# Patient Record
Sex: Female | Born: 1990 | Race: Black or African American | Hispanic: No | Marital: Married | Smoking: Current every day smoker
Health system: Southern US, Community
[De-identification: ages and names within clinical notes are randomized; demographics above are authoritative.]

## PROBLEM LIST (undated history)

## (undated) DIAGNOSIS — B1920 Unspecified viral hepatitis C without hepatic coma: Secondary | ICD-10-CM

## (undated) DIAGNOSIS — I10 Essential (primary) hypertension: Secondary | ICD-10-CM

## (undated) DIAGNOSIS — D6800 Von Willebrand disease, unspecified: Secondary | ICD-10-CM

## (undated) HISTORY — DX: Von Willebrand disease, unspecified: D68.00

## (undated) HISTORY — DX: Unspecified viral hepatitis C without hepatic coma: B19.20

## (undated) HISTORY — DX: Von Willebrand's disease: D68.0

---

## 2017-08-02 HISTORY — PX: MOUTH SURGERY: SHX715

## 2019-05-22 LAB — OB RESULTS CONSOLE RUBELLA ANTIBODY, IGM: Rubella: IMMUNE

## 2019-05-22 LAB — HEPATITIS C ANTIBODY: HCV Ab: POSITIVE

## 2019-05-22 LAB — OB RESULTS CONSOLE HEPATITIS B SURFACE ANTIGEN: Hepatitis B Surface Ag: NEGATIVE

## 2019-08-03 NOTE — L&D Delivery Note (Addendum)
OB/GYN Faculty Practice Delivery Note  Brittany Barker is a 29 y.o. G2P1011 s/p VD at [redacted]w[redacted]d. She was admitted for gHTN.   ROM: 18h 26m with clear fluid GBS Status:  Negative/-- (04/14 0329) Maximum Maternal Temperature: 99.63F   Labor Progress: . Patient presented to L&D for gHTN. Initial SVE: 1/30/-3. She received Cytotec x3, foley bulb, had AROM and pitocin. She then progressed to complete.   Delivery Date/Time: 11/28/19 1624 Delivery: Called to room and patient was complete and pushing. Head delivered LOA. Short placental cord present. Shoulder and body delivered in usual fashion. Infant with spontaneous cry, placed on mother's abdomen, dried and stimulated. Cord clamped x 2 after 1-minute delay, and cut by FOB. Cord blood drawn. Placenta delivered spontaneously with gentle cord traction. Fundus firm with massage and Pitocin. Labia, perineum, vagina, and cervix inspected inspected with first degree perineal and right periurethral lacerations.  Right periurethral was hemostatic.  First degree perineal was repaired with 3-0 Vicryl Rapide that was repaired in the standard fashion. Mom with von Willebrand disease. TXA given at delivery.   Baby Weight: 3065 grams  Placenta: Sent to L&D Complications: None Lacerations: right periurethral and first degree perineal, perineal laceration repaired with 3-0 Vicryl rapide EBL: 450 mL Analgesia: Epidural   Infant: APGAR (1 MIN): 8   APGAR (5 MINS): 9    Brittany Cabal, DO PGY-1, [facility] 11/28/2019 5:24 PM      OB FELLOW ATTESTATION  I was present, gloved, and supervising throughout delivery and have edited the above note to reflect any changes or updates.  Brittany Seal, MD/[facility] OB Fellow  11/28/2019, 5:43 PM

## 2019-08-19 ENCOUNTER — Emergency Department (HOSPITAL_COMMUNITY)
Admission: EM | Admit: 2019-08-19 | Discharge: 2019-08-19 | Disposition: A | Discharge status: Home / Self Care | Payer: Medicaid Other | Attending: Emergency Medicine | Admitting: Emergency Medicine

## 2019-08-19 ENCOUNTER — Encounter (HOSPITAL_COMMUNITY): Payer: Self-pay

## 2019-08-19 ENCOUNTER — Emergency Department (HOSPITAL_COMMUNITY): Payer: Medicaid Other

## 2019-08-19 DIAGNOSIS — R102 Pelvic and perineal pain: Secondary | ICD-10-CM

## 2019-08-19 DIAGNOSIS — Z20822 Contact with and (suspected) exposure to covid-19: Secondary | ICD-10-CM | POA: Insufficient documentation

## 2019-08-19 DIAGNOSIS — O99891 Other specified diseases and conditions complicating pregnancy: Secondary | ICD-10-CM | POA: Diagnosis not present

## 2019-08-19 DIAGNOSIS — R109 Unspecified abdominal pain: Secondary | ICD-10-CM | POA: Insufficient documentation

## 2019-08-19 DIAGNOSIS — Z3A24 24 weeks gestation of pregnancy: Secondary | ICD-10-CM | POA: Insufficient documentation

## 2019-08-19 DIAGNOSIS — R05 Cough: Secondary | ICD-10-CM | POA: Insufficient documentation

## 2019-08-19 DIAGNOSIS — Z79899 Other long term (current) drug therapy: Secondary | ICD-10-CM | POA: Insufficient documentation

## 2019-08-19 DIAGNOSIS — Z7189 Other specified counseling: Secondary | ICD-10-CM

## 2019-08-19 DIAGNOSIS — O26899 Other specified pregnancy related conditions, unspecified trimester: Secondary | ICD-10-CM

## 2019-08-19 LAB — URINALYSIS, ROUTINE W REFLEX MICROSCOPIC
Bilirubin Urine: NEGATIVE
Glucose, UA: NEGATIVE mg/dL
Hgb urine dipstick: NEGATIVE
Ketones, ur: NEGATIVE mg/dL
Leukocytes,Ua: NEGATIVE
Nitrite: NEGATIVE
Protein, ur: NEGATIVE mg/dL
Specific Gravity, Urine: 1.01 (ref 1.005–1.030)
pH: 7 (ref 5.0–8.0)

## 2019-08-19 LAB — SARS CORONAVIRUS 2 (TAT 6-24 HRS): SARS Coronavirus 2: NEGATIVE

## 2019-08-19 MED ORDER — ACETAMINOPHEN 325 MG PO TABS
650.0000 mg | ORAL_TABLET | Freq: Once | ORAL | Status: AC
  Administered 2019-08-19: 650 mg via ORAL
  Filled 2019-08-19: qty 2

## 2019-08-19 NOTE — ED Provider Notes (Addendum)
[facility]-EMERGENCY DEPT Provider Note   CSN: [id number] Arrival date & time: 08/19/19  4696     History Chief Complaint  Patient presents with  . Cough    [redacted] weeks pregnant  . Flank Pain    Brittany Barker is a 29 y.o. female who is currently 23 weeks, presents to ED with a chief complaint of left-sided flank pain and cough.  States that the pain has been going on for the past 2 to 3 days and has been intermittent.  She denies any dysuria or hematuria.  States that her vaginal discharge has not changed since from when she found out she was pregnant.  She recently area 3 weeks ago and has not yet established care with an OB/GYN.  She has also been complaining of a cough for the past week.  Sick contacts at home with similar symptoms but believes that it is due to environmental factors rather than infectious.  She denies any injuries or falls, abnormal vaginal bleeding, productive cough, fever, chest pain or shortness of breath.  HPI     History reviewed. No pertinent past medical history.  There are no problems to display for this patient.    OB History    Gravida  1   Para      Term      Preterm      AB      Living        SAB      TAB      Ectopic      Multiple      Live Births              History reviewed. No pertinent family history.  Social History   Tobacco Use  . Smoking status: Not on file  Substance Use Topics  . Alcohol use: Not on file  . Drug use: Not on file    Home Medications Prior to Admission medications   Medication Sig Start Date End Date Taking? Authorizing Provider  Ascorbic Acid (VITAMIN C) 1000 MG tablet Take 1,000 mg by mouth daily.   Yes [provider]  ibuprofen (ADVIL) 200 MG tablet Take 600 mg by mouth every 6 (six) hours as needed for moderate pain.    Yes [provider]  OVER THE COUNTER MEDICATION Take 1 Package by mouth daily as needed (sick).   Yes [provider]    Prenatal Vit-Fe Fumarate-FA (MULTIVITAMIN-PRENATAL) 27-0.8 MG TABS tablet Take 1 tablet by mouth daily at 12 noon.   Yes [provider]    Allergies    Patient has no known allergies.  Review of Systems   Review of Systems  Constitutional: Negative for appetite change, chills and fever.  HENT: Negative for ear pain, rhinorrhea, sneezing and sore throat.   Eyes: Negative for photophobia and visual disturbance.  Respiratory: Positive for cough. Negative for chest tightness, shortness of breath and wheezing.   Cardiovascular: Negative for chest pain and palpitations.  Gastrointestinal: Negative for abdominal pain, blood in stool, constipation, diarrhea, nausea and vomiting.  Genitourinary: Positive for flank pain. Negative for dysuria, hematuria and urgency.  Musculoskeletal: Negative for myalgias.  Skin: Negative for rash.  Neurological: Negative for dizziness, weakness and light-headedness.    Physical Exam Updated Vital Signs BP 130/79   Pulse 71   Temp 98.3 F (36.8 C) (Oral)   Resp 13   Ht 5\' 7"  (1.702 m)   SpO2 100%   Physical Exam Vitals  and nursing note reviewed.  Constitutional:      General: She is not in acute distress.    Appearance: She is well-developed.     Comments: Nontoxic appearing and in no acute distress.  HENT:     Head: Normocephalic and atraumatic.     Nose: Nose normal.  Eyes:     General: No scleral icterus.       Right eye: No discharge.        Left eye: No discharge.     Conjunctiva/sclera: Conjunctivae normal.  Cardiovascular:     Rate and Rhythm: Normal rate and regular rhythm.     Heart sounds: Normal heart sounds. No murmur. No friction rub. No gallop.   Pulmonary:     Effort: Pulmonary effort is normal. No respiratory distress.     Breath sounds: Normal breath sounds.  Abdominal:     General: Bowel sounds are normal. There is no distension.     Palpations: Abdomen is soft.     Tenderness: There is abdominal tenderness (L  flank). There is no guarding.     Comments: Gravid uterus.  Musculoskeletal:        General: Normal range of motion.     Cervical back: Normal range of motion and neck supple.  Skin:    General: Skin is warm and dry.     Findings: No rash.  Neurological:     Mental Status: She is alert.     Motor: No abnormal muscle tone.     Coordination: Coordination normal.     ED Results / Procedures / Treatments   Labs (all labs ordered are listed, but only abnormal results are displayed) Labs Reviewed  SARS CORONAVIRUS 2 (TAT 6-24 HRS)  URINE CULTURE  URINALYSIS, ROUTINE W REFLEX MICROSCOPIC    EKG None  Radiology US OB Limited  Result Date: 08/19/2019 CLINICAL DATA:  Right lower quadrant pain for 1 week EXAM: LIMITED OBSTETRIC ULTRASOUND FINDINGS: Number of Fetuses: 1 Heart Rate:  149 bpm Movement: Yes Presentation: Cephalic Placental Location: Fundal Previa: No Amniotic Fluid (Subjective):  Within normal limits. BPD: 6.1 cm 24 w  5 d MATERNAL FINDINGS: Cervix:  Appears closed. Uterus/Adnexae: Right ovary appears unremarkable. Left ovary was not visualized. IMPRESSION: 1. Single live intrauterine gestation measuring 24 weeks 5 days by BPD. 2. Active fetal heart tones at 149 beats per minute. This exam is performed on an emergent basis and does not comprehensively evaluate fetal size, dating, or anatomy; follow-up complete OB US should be considered if further fetal assessment is warranted. Electronically Signed   By: Duanne Guess D.O.   On: 08/19/2019 11:57    Procedures Procedures (including critical care time)  Medications Ordered in ED Medications  acetaminophen (TYLENOL) tablet 650 mg (650 mg Oral Given 08/19/19 1153)    ED Course  I have reviewed the triage vital signs and the nursing notes.  Pertinent labs & imaging results that were available during my care of the patient were reviewed by me and considered in my medical decision making (see chart for details).    MDM  Rules/Calculators/A&P                      Brittany Barker was evaluated in Emergency Department on 08/19/2019 for the symptoms described in the history of present illness. She was evaluated in the context of the global COVID-19 pandemic, which necessitated consideration that the patient might be at risk for infection with the SARS-CoV-2 virus that  causes COVID-19. Institutional protocols and algorithms that pertain to the evaluation of patients at risk for COVID-19 are in a state of rapid change based on information released by regulatory bodies including the CDC and federal and state organizations. These policies and algorithms were followed during the patient's care in the ED.  29yo F at [redacted] weeks gestation, who presents to the ED for cough and left-sided flank pain. Denies SOB, chest pain, abdominal pain, abnormal vaginal bleeding. She is afebrile and no signs of respiratory distress.  No rebound guarding.  She is requesting COVID testing. Denies urinary symptoms. Declines pelvic exam or blood work, denies vaginal discharge or concern for STDs. UA negative here. U/S without abnormal findings. No signs of pyelonephritis on exam, no CVA tenderness. Pain controlled here with Tylenol. Able to tolerate PO intake without difficulty. Will have her f/u with OB and PCP and return for worsening symptoms.  Patient is hemodynamically stable, in NAD, and able to ambulate in the ED. Evaluation does not show pathology that would require ongoing emergent intervention or inpatient treatment. I explained the diagnosis to the patient. Pain has been managed and has no complaints prior to discharge. Patient is comfortable with above plan and is stable for discharge at this time. All questions were answered prior to disposition. Strict return precautions for returning to the ED were discussed. Encouraged follow up with PCP.   An After Visit Summary was printed and given to the patient.   Portions of this note were generated with  [employer]. Dictation errors may occur despite best attempts at proofreading.  Final Clinical Impression(s) / ED Diagnoses Final diagnoses:  [redacted] weeks gestation of pregnancy  Educated about 2019 novel coronavirus infection    Rx / DC Orders ED Discharge Orders    None       Delia Heady, PA-C 08/19/19 1245    Veryl Speak, MD 08/19/19 1444

## 2019-08-19 NOTE — Discharge Instructions (Signed)
We will contact you with the results of your Covid test when available. Continue your home medications as previously prescribed. Follow up with the women's clinic and the PCP listed below. Return to the ED if you start to have worsening abdominal pain, shortness of breath, chest pain, abnormal bleeding.

## 2019-08-19 NOTE — ED Triage Notes (Signed)
Pt reports that she is [redacted] weeks pregnant. States that she last saw a doctor 10 weeks ago because she recently moved. She is complaining of a nonproductive cough, a headache, and L flank pain. She also states that she feels a lump where the pain is on her L side. A&Ox4. Ambulatory. Wants a referral for a PCP.

## 2019-08-20 LAB — URINE CULTURE: Culture: <10000 — AB

## 2019-09-14 ENCOUNTER — Encounter: Payer: Self-pay | Admitting: Certified Nurse Midwife

## 2019-09-14 ENCOUNTER — Other Ambulatory Visit: Payer: Self-pay

## 2019-09-14 ENCOUNTER — Ambulatory Visit (INDEPENDENT_AMBULATORY_CARE_PROVIDER_SITE_OTHER): Payer: Medicaid Other | Admitting: Certified Nurse Midwife

## 2019-09-14 ENCOUNTER — Other Ambulatory Visit: Payer: Medicaid Other

## 2019-09-14 VITALS — BP 120/80 | HR 84 | Wt 232.0 lb

## 2019-09-14 DIAGNOSIS — Z3481 Encounter for supervision of other normal pregnancy, first trimester: Secondary | ICD-10-CM

## 2019-09-14 DIAGNOSIS — D6800 Von Willebrand disease, unspecified: Secondary | ICD-10-CM

## 2019-09-14 DIAGNOSIS — Z3480 Encounter for supervision of other normal pregnancy, unspecified trimester: Secondary | ICD-10-CM

## 2019-09-14 DIAGNOSIS — O98413 Viral hepatitis complicating pregnancy, third trimester: Secondary | ICD-10-CM | POA: Insufficient documentation

## 2019-09-14 DIAGNOSIS — O98411 Viral hepatitis complicating pregnancy, first trimester: Secondary | ICD-10-CM

## 2019-09-14 DIAGNOSIS — Z8659 Personal history of other mental and behavioral disorders: Secondary | ICD-10-CM

## 2019-09-14 DIAGNOSIS — O99330 Smoking (tobacco) complicating pregnancy, unspecified trimester: Secondary | ICD-10-CM

## 2019-09-14 DIAGNOSIS — F1911 Other psychoactive substance abuse, in remission: Secondary | ICD-10-CM

## 2019-09-14 DIAGNOSIS — O99331 Smoking (tobacco) complicating pregnancy, first trimester: Secondary | ICD-10-CM

## 2019-09-14 DIAGNOSIS — Z3A11 11 weeks gestation of pregnancy: Secondary | ICD-10-CM

## 2019-09-14 HISTORY — DX: Encounter for supervision of other normal pregnancy, unspecified trimester: Z34.80

## 2019-09-14 MED ORDER — SERTRALINE HCL 50 MG PO TABS: 50.0000 mg | ORAL_TABLET | Freq: Every day | ORAL | 2 refills | Status: DC

## 2019-09-14 MED ORDER — BLOOD PRESSURE KIT: 1.0000 | PACK | Freq: Every day | 0 refills | Status: AC

## 2019-09-14 NOTE — Patient Instructions (Addendum)
Third Trimester of Pregnancy  The third trimester is from week 28 through week 40 (months 7 through 9). This trimester is when your unborn baby (fetus) is growing very fast. At the end of the ninth month, the unborn baby is about 20 inches in length. It weighs about 6-10 pounds. Follow these instructions at home: Medicines  Take over-the-counter and prescription medicines only as told by your doctor. Some medicines are safe and some medicines are not safe during pregnancy.  Take a prenatal vitamin that contains at least 600 micrograms (mcg) of folic acid.  If you have trouble pooping (constipation), take medicine that will make your stool soft (stool softener) if your doctor approves. Eating and drinking   Eat regular, healthy meals.  Avoid raw meat and uncooked cheese.  If you get low calcium from the food you eat, talk to your doctor about taking a daily calcium supplement.  Eat four or five small meals rather than three large meals a day.  Avoid foods that are high in fat and sugars, such as fried and sweet foods.  To prevent constipation: ? Eat foods that are high in fiber, like fresh fruits and vegetables, whole grains, and beans. ? Drink enough fluids to keep your pee (urine) clear or pale yellow. Activity  Exercise only as told by your doctor. Stop exercising if you start to have cramps.  Avoid heavy lifting, wear low heels, and sit up straight.  Do not exercise if it is too hot, too humid, or if you are in a place of great height (high altitude).  You may continue to have sex unless your doctor tells you not to. Relieving pain and discomfort  Wear a good support bra if your breasts are tender.  Take frequent breaks and rest with your legs raised if you have leg cramps or low back pain.  Take warm water baths (sitz baths) to soothe pain or discomfort caused by hemorrhoids. Use hemorrhoid cream if your doctor approves.  If you develop puffy, bulging veins (varicose  veins) in your legs: ? Wear support hose or compression stockings as told by your doctor. ? Raise (elevate) your feet for 15 minutes, 3-4 times a day. ? Limit salt in your food. Safety  Wear your seat belt when driving.  Make a list of emergency phone numbers, including numbers for family, friends, the hospital, and police and fire departments. Preparing for your baby's arrival To prepare for the arrival of your baby:  Take prenatal classes.  Practice driving to the hospital.  Visit the hospital and tour the maternity area.  Talk to your work about taking leave once the baby comes.  Pack your hospital bag.  Prepare the baby's room.  Go to your doctor visits.  Buy a rear-facing car seat. Learn how to install it in your car. General instructions  Do not use hot tubs, steam rooms, or saunas.  Do not use any products that contain nicotine or tobacco, such as cigarettes and e-cigarettes. If you need help quitting, ask your doctor.  Do not drink alcohol.  Do not douche or use tampons or scented sanitary pads.  Do not cross your legs for long periods of time.  Do not travel for long distances unless you must. Only do so if your doctor says it is okay.  Visit your dentist if you have not gone during your pregnancy. Use a soft toothbrush to brush your teeth. Be gentle when you floss.  Avoid cat litter boxes and soil   used by cats. These carry germs that can cause birth defects in the baby and can cause a loss of your baby (miscarriage) or stillbirth.  Keep all your prenatal visits as told by your doctor. This is important. Contact a doctor if:  You are not sure if you are in labor or if your water has broken.  You are dizzy.  You have mild cramps or pressure in your lower belly.  You have a nagging pain in your belly area.  You continue to feel sick to your stomach, you throw up, or you have watery poop.  You have bad smelling fluid coming from your vagina.  You have  pain when you pee. Get help right away if:  You have a fever.  You are leaking fluid from your vagina.  You are spotting or bleeding from your vagina.  You have severe belly cramps or pain.  You lose or gain weight quickly.  You have trouble catching your breath and have chest pain.  You notice sudden or extreme puffiness (swelling) of your face, hands, ankles, feet, or legs.  You have not felt the baby move in over an hour.  You have severe headaches that do not go away with medicine.  You have trouble seeing.  You are leaking, or you are having a gush of fluid, from your vagina before you are 37 weeks.  You have regular belly spasms (contractions) before you are 37 weeks. Summary  The third trimester is from week 28 through week 40 (months 7 through 9). This time is when your unborn baby is growing very fast.  Follow your doctor's advice about medicine, food, and activity.  Get ready for the arrival of your baby by taking prenatal classes, getting all the baby items ready, preparing the baby's room, and visiting your doctor to be checked.  Get help right away if you are bleeding from your vagina, or you have chest pain and trouble catching your breath, or if you have not felt your baby move in over an hour. This information is not intended to replace advice given to you by your health care provider. Make sure you discuss any questions you have with your health care provider. Document Revised: 11/09/2018 Document Reviewed: 08/24/2016 Elsevier Patient Education  2020 [employer].  Safe Medications in Pregnancy   Acne: Benzoyl Peroxide Salicylic Acid  Backache/Headache: Tylenol: 2 regular strength every 4 hours OR              2 Extra strength every 6 hours  Colds/Coughs/Allergies: Benadryl (alcohol free) 25 mg every 6 hours as needed Breath right strips Claritin Cepacol throat lozenges Chloraseptic throat spray Cold-Eeze- up to three times per day Cough  drops, alcohol free Flonase (by prescription only) Guaifenesin Mucinex Robitussin DM (plain only, alcohol free) Saline nasal spray/drops Sudafed (pseudoephedrine) & Actifed ** use only after [redacted] weeks gestation and if you do not have high blood pressure Tylenol Vicks Vaporub Zinc lozenges Zyrtec   Constipation: Colace Ducolax suppositories Fleet enema Glycerin suppositories Metamucil Milk of magnesia Miralax Senokot Smooth move tea  Diarrhea: Kaopectate Imodium A-D  *NO pepto Bismol  Hemorrhoids: Anusol Anusol HC Preparation H Tucks  Indigestion: Tums Maalox Mylanta Zantac  Pepcid  Insomnia: Benadryl (alcohol free) 25mg  every 6 hours as needed Tylenol PM Unisom, no Gelcaps  Leg Cramps: Tums MagGel  Nausea/Vomiting:  Bonine Dramamine Emetrol Ginger extract Sea bands Meclizine  Nausea medication to take during pregnancy:  Unisom (doxylamine succinate 25 mg tablets) Take  one tablet daily at bedtime. If symptoms are not adequately controlled, the dose can be increased to a maximum recommended dose of two tablets daily (1/2 tablet in the morning, 1/2 tablet mid-afternoon and one at bedtime). Vitamin B6 100mg  tablets. Take one tablet twice a day (up to 200 mg per day).  Skin Rashes: Aveeno products Benadryl cream or 25mg  every 6 hours as needed Calamine Lotion 1% cortisone cream  Yeast infection: Gyne-lotrimin 7 Monistat 7   **If taking multiple medications, please check labels to avoid duplicating the same active ingredients **take medication as directed on the label ** Do not exceed 4000 mg of tylenol in 24 hours **Do not take medications that contain aspirin or ibuprofen

## 2019-09-14 NOTE — Progress Notes (Signed)
urPatient presents as a New OB transfer from [place].

## 2019-09-14 NOTE — Progress Notes (Signed)
History:   Brittany Barker is a 29 y.o. G2P0010 at 78w6dby LMP being seen today for her first obstetrical visit at this practice. She is a transfer from [facility] Prenatal records are scanned under media and fully reviewed.  Her obstetrical history is significant for smoker and von willebrand disease, recently diagnosed Hep C . Patient does intend to breast feed. Pregnancy history fully reviewed.  Patient reports no complaints.     HISTORY: OB History  Gravida Para Term Preterm AB Living  2 0 0 0 1 0  SAB TAB Ectopic Multiple Live Births  1 0 0 0 0    # Outcome Date GA Lbr Len/2nd Weight Sex Delivery Anes PTL Lv  2 Current           1 SAB 02/15/14            Last pap smear was done 2015 and was normal per patient and prenatal records. Needs postpartum pap.   Past Medical History:  Diagnosis Date  . Von Willebrand disease ([place]    Past Surgical History:  Procedure Laterality Date  . MOUTH SURGERY  2019   Family History  Problem Relation Age of Onset  . Bipolar disorder Mother   . Hypertension Maternal Grandmother   . Colon cancer Maternal Grandmother    Social History   Tobacco Use  . Smoking status: Current Every Day Smoker    Types: Cigarettes  . Smokeless tobacco: Never Used  Substance Use Topics  . Alcohol use: Not Currently  . Drug use: Yes    Types: Marijuana    Comment: daily   No Known Allergies Current Outpatient Medications on File Prior to Visit  Medication Sig Dispense Refill  . OVER THE COUNTER MEDICATION Take 1 Package by mouth daily as needed (sick).    . Prenatal Vit-Fe Fumarate-FA (MULTIVITAMIN-PRENATAL) 27-0.8 MG TABS tablet Take 1 tablet by mouth daily at 12 noon.    . Ascorbic Acid (VITAMIN C) 1000 MG tablet Take 1,000 mg by mouth daily.     No current facility-administered medications on file prior to visit.    Review of Systems Pertinent items noted in HPI and remainder of comprehensive ROS otherwise  negative. Physical Exam:   Vitals:   09/14/19 0922  BP: 120/80  Pulse: 84  Weight: 232 lb (105.2 kg)   Fetal Heart Rate (bpm): 145 Uterus:  Fundal Height: 31 cm  System: General: well-developed, well-nourished female in no acute distress   Skin: normal coloration and turgor, no rashes   Neurologic: oriented, normal, negative, normal mood   Extremities: normal strength, tone, and muscle mass, ROM of all joints is normal   HEENT PERRLA, extraocular movement intact and sclera clear, anicteric   Mouth/Teeth mucous membranes moist, pharynx normal without lesions and dental hygiene good   Neck supple and no masses   Cardiovascular: regular rate and rhythm   Respiratory:  no respiratory distress, normal breath sounds   Abdomen: soft, non-tender; gravid large for gestational age. bowel sounds normal; no masses,  no organomegaly      Assessment:    Pregnancy: G2P0010 Patient Active Problem List   Diagnosis Date Noted  . Encounter for supervision of other normal pregnancy, unspecified trimester 09/14/2019  . Hx of anxiety disorder 09/14/2019  . Viral hepatitis complicating pregnancy, third trimester 09/14/2019  . [place]illebrand disease ([place] 09/14/2019  . History of substance abuse ([place] 09/14/2019  . Tobacco use during pregnancy, antepartum 09/14/2019  Plan:    1. Encounter for supervision of other normal pregnancy, unspecified trimester - Welcomed to practice and introduced self to patient  - S>D, patient denies having a anatomy or fetal growth [place] during this pregnancy, prenatal records indicate viability Korea early pregnancy and chart review shows limited US performed in ED - Reviewed prenatal records with patient and encouraged report of additional history  - Educated and discussed safe medications during pregnancy, patient reports current use of ibuprofen - patient states "I have been taking ibuprofen all pregnancy", discussed risk of ibuprofen in 1st and 3rd trimesters,  encouraged to stop.  - Patient reports she is taking it due to tooth pain from sensitivity, encouraged use of Sensodyne toothpaste for sensitive teeth to build enamel.  - Glucose Tolerance, 2 Hours w/1 Hour - CBC - HIV Antibody (routine testing w rflx) - RPR - Genetic Screening - Blood Pressure KIT; 1 kit by Does not apply route daily.  Dispense: 1 kit; Refill: 0 - Culture, OB Urine - [place] MFM OB DETAIL +14 WK; Future  2. Von Willebrand disease ([place]) - Patient reports requiring iron supplementation prior to pregnancy but currently not being on any medication - Comp Met (CMET) - [place] MFM OB DETAIL +14 WK; Future  3. Viral hepatitis complicating pregnancy, third trimester - Patient reports recent diagnoses of Hep C after initial OB labs in [place], she denies having treatment d/t being pregnant  - Hepatitis C Antibody - [place] MFM OB DETAIL +14 WK; Future  4. Hx of anxiety disorder - Hx of anxiety and depression  - Was taking buspar but stopped all medication once she found out she was pregnant - Patient reports anxiety and depression is starting to get worse, educated and discussed use of medication and referral to integrated behavioral health- patient agrees to plan of care  - Encouraged patient not to abruptly stop taking medication if she feels no change in 3-4 weeks, medication can be increased- patient verbalizes understanding   - sertraline (ZOLOFT) 50 MG tablet; Take 1 tablet (50 mg total) by mouth daily.  Dispense: 30 tablet; Refill: 2 - Ambulatory referral to [facility]  5. 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  - Patient currently using cigarettes  - Denies any use of substances or drugs since being pregnant  - Ambulatory referral to [facility]  6. Tobacco use during pregnancy, antepartum - Educated and discussed tobacco use during  pregnancy and risk to fetus   GTT and third trimester labs drawn  Continue prenatal vitamins. Genetic Screening discussed, NIPS: ordered. Ultrasound discussed; fetal anatomic survey: ordered. Problem list reviewed and updated. The nature of [facility] with multiple MDs and other Advanced Practice Providers was explained to patient; also emphasized that residents, students are part of our team. Routine obstetric precautions reviewed. Return in about 2 weeks (around 09/28/2019) for ROB-HROB MD Only .     Lajean Manes, [place] for [employer], [place]

## 2019-09-15 LAB — CBC
Hematocrit: 31.7 % — ABNORMAL LOW (ref 34.0–46.6)
Hemoglobin: 10.9 g/dL — ABNORMAL LOW (ref 11.1–15.9)
MCH: 29.9 pg (ref 26.6–33.0)
MCHC: 34.4 g/dL (ref 31.5–35.7)
MCV: 87 fL (ref 79–97)
Platelets: 226 10*3/uL (ref 150–450)
RBC: 3.65 x10E6/uL — ABNORMAL LOW (ref 3.77–5.28)
RDW: 12.9 % (ref 11.7–15.4)
WBC: 10.1 10*3/uL (ref 3.4–10.8)

## 2019-09-15 LAB — GLUCOSE TOLERANCE, 2 HOURS W/ 1HR
Glucose, 1 hour: 105 mg/dL (ref 65–179)
Glucose, 2 hour: 97 mg/dL (ref 65–152)
Glucose, Fasting: 80 mg/dL (ref 65–91)

## 2019-09-15 LAB — COMPREHENSIVE METABOLIC PANEL
ALT: 16 IU/L (ref 0–32)
AST: 14 IU/L (ref 0–40)
Albumin/Globulin Ratio: 1.4 (ref 1.2–2.2)
Albumin: 3.7 g/dL — ABNORMAL LOW (ref 3.9–5.0)
Alkaline Phosphatase: 56 IU/L (ref 39–117)
BUN/Creatinine Ratio: 9 (ref 9–23)
BUN: 5 mg/dL — ABNORMAL LOW (ref 6–20)
Bilirubin Total: 0.5 mg/dL (ref 0.0–1.2)
CO2: 22 mmol/L (ref 20–29)
Calcium: 8.9 mg/dL (ref 8.7–10.2)
Chloride: 104 mmol/L (ref 96–106)
Creatinine, Ser: 0.57 mg/dL (ref 0.57–1.00)
GFR calc Af Amer: 145 mL/min/{1.73_m2} (ref >59)
GFR calc non Af Amer: 126 mL/min/{1.73_m2} (ref >59)
Globulin, Total: 2.7 g/dL (ref 1.5–4.5)
Glucose: 105 mg/dL — ABNORMAL HIGH (ref 65–99)
Potassium: 3.5 mmol/L (ref 3.5–5.2)
Sodium: 139 mmol/L (ref 134–144)
Total Protein: 6.4 g/dL (ref 6.0–8.5)

## 2019-09-15 LAB — HIV ANTIBODY (ROUTINE TESTING W REFLEX): HIV Screen 4th Generation wRfx: NONREACTIVE

## 2019-09-15 LAB — HEPATITIS C ANTIBODY: Hep C Virus Ab: >11 s/co ratio — ABNORMAL HIGH (ref 0.0–0.9)

## 2019-09-15 LAB — RPR: RPR Ser Ql: NONREACTIVE

## 2019-09-16 LAB — CULTURE, OB URINE

## 2019-09-16 LAB — URINE CULTURE, OB REFLEX

## 2019-09-24 ENCOUNTER — Ambulatory Visit: Payer: Medicaid Other | Admitting: Licensed Clinical Social Worker

## 2019-09-24 NOTE — BH Specialist Note (Signed)
No show

## 2019-09-25 ENCOUNTER — Ambulatory Visit (INDEPENDENT_AMBULATORY_CARE_PROVIDER_SITE_OTHER): Payer: Medicaid Other | Admitting: Licensed Clinical Social Worker

## 2019-09-25 DIAGNOSIS — Z91199 Patient's noncompliance with other medical treatment and regimen due to unspecified reason: Secondary | ICD-10-CM

## 2019-09-25 DIAGNOSIS — Z5329 Procedure and treatment not carried out because of patient's decision for other reasons: Secondary | ICD-10-CM

## 2019-09-25 NOTE — Progress Notes (Signed)
Pt no show appt  

## 2019-10-01 ENCOUNTER — Ambulatory Visit (INDEPENDENT_AMBULATORY_CARE_PROVIDER_SITE_OTHER): Payer: Medicaid Other | Admitting: Obstetrics and Gynecology

## 2019-10-01 ENCOUNTER — Encounter: Payer: Self-pay | Admitting: Obstetrics and Gynecology

## 2019-10-01 ENCOUNTER — Other Ambulatory Visit: Payer: Self-pay

## 2019-10-01 ENCOUNTER — Encounter: Payer: Self-pay | Admitting: Certified Nurse Midwife

## 2019-10-01 VITALS — BP 115/70 | HR 98 | Wt 243.0 lb

## 2019-10-01 DIAGNOSIS — D6800 Von Willebrand disease, unspecified: Secondary | ICD-10-CM

## 2019-10-01 DIAGNOSIS — Z3480 Encounter for supervision of other normal pregnancy, unspecified trimester: Secondary | ICD-10-CM

## 2019-10-01 DIAGNOSIS — Z3A29 29 weeks gestation of pregnancy: Secondary | ICD-10-CM

## 2019-10-01 DIAGNOSIS — O98413 Viral hepatitis complicating pregnancy, third trimester: Secondary | ICD-10-CM

## 2019-10-01 DIAGNOSIS — Z8659 Personal history of other mental and behavioral disorders: Secondary | ICD-10-CM

## 2019-10-01 MED ORDER — PANTOPRAZOLE SODIUM 40 MG PO TBEC: 40.0000 mg | DELAYED_RELEASE_TABLET | Freq: Every day | ORAL | 3 refills | Status: DC

## 2019-10-01 MED ORDER — BLOOD PRESSURE KIT DEVI: 1.0000 | 0 refills | Status: AC

## 2019-10-01 NOTE — Progress Notes (Signed)
ROB c/o gas and heartburns.  She takes TUMS but it does not work.

## 2019-10-01 NOTE — Progress Notes (Signed)
   PRENATAL VISIT NOTE  Subjective:  Brittany Barker is a 29 y.o. G2P0010 at [redacted]w[redacted]d being seen today for ongoing prenatal care.  She is currently monitored for the following issues for this high-risk pregnancy and has Encounter for supervision of other normal pregnancy, unspecified trimester; Hx of anxiety disorder; Viral hepatitis complicating pregnancy, third trimester; Von Willebrand disease (HCC); History of substance abuse (HCC); Tobacco use during pregnancy, antepartum; and Hx of bipolar disorder on their problem list.  Patient reports heartburn.  Contractions: Not present. Vag. Bleeding: None.  Movement: Present. Denies leaking of fluid.   The following portions of the patient's history were reviewed and updated as appropriate: allergies, current medications, past family history, past medical history, past social history, past surgical history and problem list.   Objective:   Vitals:   10/01/19 0907  BP: 115/70  Pulse: 98  Weight: 243 lb (110.2 kg)    Fetal Status: Fetal Heart Rate (bpm): 141 Fundal Height: 29 cm Movement: Present     General:  Alert, oriented and cooperative. Patient is in no acute distress.  Skin: Skin is warm and dry. No rash noted.   Cardiovascular: Normal heart rate noted  Respiratory: Normal respiratory effort, no problems with respiration noted  Abdomen: Soft, gravid, appropriate for gestational age.  Pain/Pressure: Absent     Pelvic: Cervical exam deferred        Extremities: Normal range of motion.  Edema: Trace  Mental Status: Normal mood and affect. Normal behavior. Normal judgment and thought content.   Assessment and Plan:  Pregnancy: G2P0010 at [redacted]w[redacted]d 1. Encounter for supervision of other normal pregnancy, unspecified trimester Patient is doing well Rx protonix provided Patient undecided on pediatrician  2. Viral hepatitis complicating pregnancy, third trimester Will follow up pp  3. Von Willebrand disease (HCC) May need ddavp at delivery  4.  Hx of bipolar disorder Currently not on medication  5. Hx of anxiety disorder Currently not on medication  Preterm labor symptoms and general obstetric precautions including but not limited to vaginal bleeding, contractions, leaking of fluid and fetal movement were reviewed in detail with the patient. Please refer to After Visit Summary for other counseling recommendations.   Return in about 2 weeks (around 10/15/2019) for Virtual, ROB, High risk.  Future Appointments  Date Time Provider Department Center  10/01/2019  9:30 AM Miko Sirico, Gigi Gin, MD [facility] None  10/11/2019  9:00 AM [facility] NURSE [facility]  10/11/2019  9:00 AM [facility] [place] 3 [facility]    Catalina Antigua, MD

## 2019-10-11 ENCOUNTER — Ambulatory Visit (HOSPITAL_COMMUNITY)
Admission: RE | Admit: 2019-10-11 | Discharge: 2019-10-11 | Disposition: A | Discharge status: Home / Self Care | Payer: Medicaid Other | Source: Ambulatory Visit | Attending: Obstetrics and Gynecology | Admitting: Obstetrics and Gynecology

## 2019-10-11 ENCOUNTER — Ambulatory Visit (HOSPITAL_COMMUNITY): Payer: Medicaid Other | Admitting: *Deleted

## 2019-10-11 ENCOUNTER — Other Ambulatory Visit: Payer: Self-pay

## 2019-10-11 ENCOUNTER — Other Ambulatory Visit (HOSPITAL_COMMUNITY): Payer: Self-pay | Admitting: *Deleted

## 2019-10-11 ENCOUNTER — Encounter (HOSPITAL_COMMUNITY): Payer: Self-pay

## 2019-10-11 VITALS — BP 126/67 | HR 84 | Temp 98.2°F

## 2019-10-11 DIAGNOSIS — Z3480 Encounter for supervision of other normal pregnancy, unspecified trimester: Secondary | ICD-10-CM | POA: Insufficient documentation

## 2019-10-11 DIAGNOSIS — Z362 Encounter for other antenatal screening follow-up: Secondary | ICD-10-CM

## 2019-10-11 DIAGNOSIS — O2690 Pregnancy related conditions, unspecified, unspecified trimester: Secondary | ICD-10-CM | POA: Diagnosis not present

## 2019-10-11 DIAGNOSIS — O0990 Supervision of high risk pregnancy, unspecified, unspecified trimester: Secondary | ICD-10-CM | POA: Insufficient documentation

## 2019-10-11 DIAGNOSIS — D6800 Von Willebrand disease, unspecified: Secondary | ICD-10-CM

## 2019-10-11 DIAGNOSIS — O98413 Viral hepatitis complicating pregnancy, third trimester: Secondary | ICD-10-CM | POA: Diagnosis present

## 2019-10-11 DIAGNOSIS — Z3A30 30 weeks gestation of pregnancy: Secondary | ICD-10-CM | POA: Diagnosis not present

## 2019-10-11 DIAGNOSIS — Z363 Encounter for antenatal screening for malformations: Secondary | ICD-10-CM | POA: Diagnosis not present

## 2019-10-11 DIAGNOSIS — O99330 Smoking (tobacco) complicating pregnancy, unspecified trimester: Secondary | ICD-10-CM

## 2019-10-15 ENCOUNTER — Telehealth (INDEPENDENT_AMBULATORY_CARE_PROVIDER_SITE_OTHER): Payer: Medicaid Other | Admitting: Obstetrics and Gynecology

## 2019-10-15 ENCOUNTER — Encounter: Payer: Self-pay | Admitting: Obstetrics and Gynecology

## 2019-10-15 ENCOUNTER — Other Ambulatory Visit: Payer: Self-pay

## 2019-10-15 VITALS — BP 144/88 | HR 80

## 2019-10-15 DIAGNOSIS — Z3480 Encounter for supervision of other normal pregnancy, unspecified trimester: Secondary | ICD-10-CM

## 2019-10-15 DIAGNOSIS — Z3A31 31 weeks gestation of pregnancy: Secondary | ICD-10-CM

## 2019-10-15 DIAGNOSIS — D6800 Von Willebrand disease, unspecified: Secondary | ICD-10-CM

## 2019-10-15 DIAGNOSIS — O98413 Viral hepatitis complicating pregnancy, third trimester: Secondary | ICD-10-CM

## 2019-10-15 NOTE — Progress Notes (Signed)
TELEHEALTH OBSTETRICS PRENATAL VIRTUAL VIDEO VISIT ENCOUNTER NOTE  Provider location: [facility] at [place]   I connected with Brittany Barker on 10/15/19 at  9:15 AM EDT by MyChart Video Encounter at home and verified that I am speaking with the correct person using two identifiers.   I discussed the limitations, risks, security and privacy concerns of performing an evaluation and management service virtually and the availability of in person appointments. I also discussed with the patient that there may be a patient responsible charge related to this service. The patient expressed understanding and agreed to proceed. Subjective:  Brittany Barker is a 29 y.o. G2P0010 at [redacted]w[redacted]d being seen today for ongoing prenatal care.  She is currently monitored for the following issues for this high-risk pregnancy and has Encounter for supervision of other normal pregnancy, unspecified trimester; Hx of anxiety disorder; Viral hepatitis complicating pregnancy, third trimester; Von Willebrand disease (HCC); History of substance abuse (HCC); Tobacco use during pregnancy, antepartum; and Hx of bipolar disorder on their problem list.  Patient reports no complaints.  Contractions: Not present. Vag. Bleeding: None.  Movement: Present. Denies any leaking of fluid.   The following portions of the patient's history were reviewed and updated as appropriate: allergies, current medications, past family history, past medical history, past social history, past surgical history and problem list.   Objective:   Vitals:   10/15/19 0909  BP: (!) 144/88  Pulse: 80    Fetal Status:     Movement: Present     General:  Alert, oriented and cooperative. Patient is in no acute distress.  Respiratory: Normal respiratory effort, no problems with respiration noted  Mental Status: Normal mood and affect. Normal behavior. Normal judgment and thought content.  Rest of physical exam deferred due to type of  encounter  Imaging: [place] MFM OB DETAIL +14 WK  Result Date: 10/11/2019 ----------------------------------------------------------------------  OBSTETRICS REPORT                       (Signed Final 10/11/2019 11:47 am) ---------------------------------------------------------------------- Patient Info  ID #:       [id number]                          D.O.B.:  November 20, 1990 (28 yrs)  Name:       Brittany Barker                      Visit Date: 10/11/2019 09:08 am ---------------------------------------------------------------------- Performed By  Performed By:     Sandi Mealy        Secondary Phy.:   Rudean Curt                    RDMS                                                             ROGERS CNM  Attending:        Noralee Space MD        Address:          Faculty  Referred By:      [facility]             Location:  [facility]  Ref. Address:     [address]                    [address]                    [place]                    [address] ---------------------------------------------------------------------- Orders   #  Description                          Code         Ordered By   1  [place] MFM OB DETAIL +14 WK              [id number]     Steward Drone  ----------------------------------------------------------------------   #  Order #                    Accession #                 Episode #   1  [id number]                  [id number]                  [id number]  ---------------------------------------------------------------------- Indications   Encounter for antenatal screening for          Z36.3   malformations (Low Risk NIPS)   Medical complication of pregnancy (Von         O26.90   Willebrand Disease HCC)   [redacted] weeks gestation of pregnancy                Z3A.30   Tobacco use complicating pregnancy             O99.330  ---------------------------------------------------------------------- Fetal  Evaluation  Num Of Fetuses:         1  Fetal Heart Rate(bpm):  135  Cardiac Activity:       Observed  Presentation:           Cephalic  Placenta:               Anterior  P. Cord Insertion:      Visualized  Amniotic Fluid  AFI FV:      Within normal limits  AFI Sum(cm)     %Tile       Largest Pocket(cm)  14.86           52          5.37  RUQ(cm)       RLQ(cm)       LUQ(cm)        LLQ(cm)  5.37          4.38          3.09           2.02 ---------------------------------------------------------------------- Biometry  BPD:  82.8  mm     G. Age:  33w 2d         96  %    CI:        74.88   %    70 - 86                                                          FL/HC:      19.9   %    19.3 - 21.3  HC:      303.6  mm     G. Age:  33w 5d         90  %    HC/AC:      1.11        0.96 - 1.17  AC:      273.8  mm     G. Age:  31w 3d         69  %    FL/BPD:     72.8   %    71 - 87  FL:       60.3  mm     G. Age:  31w 3d         54  %    FL/AC:      22.0   %    20 - 24  HUM:      55.1  mm     G. Age:  32w 0d         78  %  CER:      36.7  mm     G. Age:  31w 5d         61  %  LV:        1.6  mm  CM:        5.9  mm  Est. FW:    1841  gm      4 lb 1 oz     75  % ---------------------------------------------------------------------- OB History  Gravidity:    2          SAB:   1  Living:       0 ---------------------------------------------------------------------- Gestational Age  LMP:           30w 5d        Date:  03/10/19                 EDD:   12/15/19  U/S Today:     32w 3d                                        EDD:   12/03/19  Best:          30w 5d     Det. By:  LMP  (03/10/19)          EDD:   12/15/19 ---------------------------------------------------------------------- Anatomy  Cranium:               Appears normal         Aortic Arch:            Not well visualized  Cavum:  Appears normal         Ductal Arch:            Not well visualized  Ventricles:            Appears normal         Diaphragm:               Appears normal  Choroid Plexus:        Appears normal         Stomach:                Appears normal, left                                                                        sided  Cerebellum:            Appears normal         Abdomen:                Appears normal  Posterior Fossa:       Appears normal         Abdominal Wall:         Appears nml (cord                                                                        insert, abd wall)  Nuchal Fold:           Not applicable (>20    Cord Vessels:           Appears normal ([redacted]                         wks GA)                                        vessel cord)  Face:                  Appears normal         Kidneys:                Appear normal                         (orbits and profile)  Lips:                  Appears normal         Bladder:                Appears normal  Thoracic:              Appears normal         Spine:                  Appears normal  Heart:  Appears normal         Upper Extremities:      Appears normal                         (4CH, axis, and                         situs)  RVOT:                  Appears normal         Lower Extremities:      Appears normal  LVOT:                  Not well visualized  Other:  Female gender. Technically difficult due to advanced gestational age.          Technically difficult due to fetal position. ---------------------------------------------------------------------- Impression  Ms. Cliett, G2, P0 at 30 weeks 5 days gestation is here for  fetal anatomy scan.  She [place] prenatal [place] from  [place].  She has chronic hepatitis C infection.  Patient does  not have gestational diabetes.  Fetal growth is appropriate for gestational age.  Amniotic fluid  is normal and good fetal activity seen.  Fetal anatomical  survey appears normal, but limited by advanced gestational  age.  We reassured the patient of the findings.  In women with hepatitis C infection, mode of delivery does  not  influence perinatal transmission (approximately 5%). ---------------------------------------------------------------------- Recommendations  -An appointment was made for her to return in 4 weeks for  fetal growth assessment and completion of fetal anatomy if  feasible. ----------------------------------------------------------------------                  Noralee Spaceavi Shankar, MD Electronically Signed Final Report   10/11/2019 11:47 am ----------------------------------------------------------------------   Assessment and Plan:  Pregnancy: G2P0010 at 6540w2d 1. Encounter for supervision of other normal pregnancy, unspecified trimester Patient is doing well without complaints Elevated BP today without symptoms. Will check BP again later this week. Discussed possible management plan if GHTN Reviewed si/sx of preeclampsia  2. Von Willebrand disease (HCC) Will refer to hematology  3. Viral hepatitis complicating pregnancy, third trimester Will follow up pp  Preterm labor symptoms and general obstetric precautions including but not limited to vaginal bleeding, contractions, leaking of fluid and fetal movement were reviewed in detail with the patient. I discussed the assessment and treatment plan with the patient. The patient was provided an opportunity to ask questions and all were answered. The patient agreed with the plan and demonstrated an understanding of the instructions. The patient was advised to call back or seek an in-person office evaluation/[facility] for any urgent or concerning symptoms. Please refer to After Visit Summary for other counseling recommendations.   I provided 15 minutes of face-to-face time during this encounter.  No follow-ups on file.  Future Appointments  Date Time Provider Department Center  11/08/2019 11:00 AM [facility] NURSE [facility]-US  11/08/2019 11:00 AM [facility] US 3 [facility] M[facility]-US    Catalina AntiguaPeggy Laquenta Whitsell, MD [facility], [facility]

## 2019-10-16 ENCOUNTER — Telehealth: Payer: Self-pay | Admitting: Hematology

## 2019-10-16 NOTE — Telephone Encounter (Signed)
Received a new hem referral from Dr. Jolayne Panther for Von Willebrand's disease. Pt is currelnty pregnant and has been scheduled to see Dr. Candise Che on 3/22 at 11am. She's been made aware to arrive 15 minutes early.

## 2019-10-16 NOTE — Telephone Encounter (Signed)
Received a new hem referral from Dr. Everette Rank

## 2019-10-19 ENCOUNTER — Ambulatory Visit (INDEPENDENT_AMBULATORY_CARE_PROVIDER_SITE_OTHER): Payer: Medicaid Other

## 2019-10-19 ENCOUNTER — Other Ambulatory Visit: Payer: Self-pay

## 2019-10-19 VITALS — BP 128/82 | HR 88 | Wt 239.2 lb

## 2019-10-19 DIAGNOSIS — R030 Elevated blood-pressure reading, without diagnosis of hypertension: Secondary | ICD-10-CM

## 2019-10-19 DIAGNOSIS — Z3480 Encounter for supervision of other normal pregnancy, unspecified trimester: Secondary | ICD-10-CM

## 2019-10-19 NOTE — Progress Notes (Signed)
Pt is here today for BP check and possible malfunctioning BP cuff. BP cuff from home reading 156/99. Pt BP in office with our cuff is 128/82. Pt is non symptomatic- denies blurry vision or headaches not relieved with tylenol. Contacted [facility] who advised me to tell the pt to bring in the malfunctioning cuff and they will switch it out with a new one for the pt.Pt made aware.

## 2019-10-22 ENCOUNTER — Inpatient Hospital Stay: Payer: Medicaid Other

## 2019-10-22 ENCOUNTER — Other Ambulatory Visit: Payer: Self-pay

## 2019-10-22 ENCOUNTER — Inpatient Hospital Stay: Payer: Medicaid Other | Attending: Hematology | Admitting: Hematology

## 2019-10-22 VITALS — BP 125/78 | HR 83 | Temp 98.2°F | Resp 18 | Ht 67.0 in | Wt 238.4 lb

## 2019-10-22 DIAGNOSIS — O99333 Smoking (tobacco) complicating pregnancy, third trimester: Secondary | ICD-10-CM | POA: Diagnosis not present

## 2019-10-22 DIAGNOSIS — D6800 Von Willebrand disease, unspecified: Secondary | ICD-10-CM

## 2019-10-22 DIAGNOSIS — O99113 Other diseases of the blood and blood-forming organs and certain disorders involving the immune mechanism complicating pregnancy, third trimester: Secondary | ICD-10-CM | POA: Diagnosis present

## 2019-10-22 DIAGNOSIS — B1920 Unspecified viral hepatitis C without hepatic coma: Secondary | ICD-10-CM | POA: Insufficient documentation

## 2019-10-22 DIAGNOSIS — F329 Major depressive disorder, single episode, unspecified: Secondary | ICD-10-CM | POA: Diagnosis not present

## 2019-10-22 DIAGNOSIS — F17210 Nicotine dependence, cigarettes, uncomplicated: Secondary | ICD-10-CM | POA: Insufficient documentation

## 2019-10-22 DIAGNOSIS — Z3A32 32 weeks gestation of pregnancy: Secondary | ICD-10-CM | POA: Diagnosis not present

## 2019-10-22 DIAGNOSIS — F419 Anxiety disorder, unspecified: Secondary | ICD-10-CM | POA: Diagnosis not present

## 2019-10-22 DIAGNOSIS — F1290 Cannabis use, unspecified, uncomplicated: Secondary | ICD-10-CM | POA: Diagnosis not present

## 2019-10-22 DIAGNOSIS — Z79899 Other long term (current) drug therapy: Secondary | ICD-10-CM | POA: Diagnosis not present

## 2019-10-22 LAB — PROTIME-INR
INR: 1 (ref 0.8–1.2)
Prothrombin Time: 12.8 seconds (ref 11.4–15.2)

## 2019-10-22 LAB — CMP (CANCER CENTER ONLY)
ALT: 9 U/L (ref 0–44)
AST: 11 U/L — ABNORMAL LOW (ref 15–41)
Albumin: 3.1 g/dL — ABNORMAL LOW (ref 3.5–5.0)
Alkaline Phosphatase: 62 U/L (ref 38–126)
Anion gap: 11 (ref 5–15)
BUN: 5 mg/dL — ABNORMAL LOW (ref 6–20)
CO2: 22 mmol/L (ref 22–32)
Calcium: 8.4 mg/dL — ABNORMAL LOW (ref 8.9–10.3)
Chloride: 105 mmol/L (ref 98–111)
Creatinine: 0.63 mg/dL (ref 0.44–1.00)
GFR, Est AFR Am: >60 mL/min (ref >60)
GFR, Estimated: >60 mL/min (ref >60)
Glucose, Bld: 71 mg/dL (ref 70–99)
Potassium: 3.1 mmol/L — ABNORMAL LOW (ref 3.5–5.1)
Sodium: 138 mmol/L (ref 135–145)
Total Bilirubin: 0.6 mg/dL (ref 0.3–1.2)
Total Protein: 6.7 g/dL (ref 6.5–8.1)

## 2019-10-22 LAB — PLATELET FUNCTION ASSAY
Collagen / ADP: >300 seconds — ABNORMAL HIGH (ref 0–118)
Collagen / Epinephrine: >300 seconds — ABNORMAL HIGH (ref 0–193)

## 2019-10-22 LAB — CBC WITH DIFFERENTIAL/PLATELET
Abs Immature Granulocytes: 0.03 10*3/uL (ref 0.00–0.07)
Basophils Absolute: 0 10*3/uL (ref 0.0–0.1)
Basophils Relative: 0 %
Eosinophils Absolute: 0.2 10*3/uL (ref 0.0–0.5)
Eosinophils Relative: 2 %
HCT: 31.4 % — ABNORMAL LOW (ref 36.0–46.0)
Hemoglobin: 10.2 g/dL — ABNORMAL LOW (ref 12.0–15.0)
Immature Granulocytes: 0 %
Lymphocytes Relative: 24 %
Lymphs Abs: 2.2 10*3/uL (ref 0.7–4.0)
MCH: 29.1 pg (ref 26.0–34.0)
MCHC: 32.5 g/dL (ref 30.0–36.0)
MCV: 89.5 fL (ref 80.0–100.0)
Monocytes Absolute: 0.7 10*3/uL (ref 0.1–1.0)
Monocytes Relative: 8 %
Neutro Abs: 5.8 10*3/uL (ref 1.7–7.7)
Neutrophils Relative %: 66 %
Platelets: 237 10*3/uL (ref 150–400)
RBC: 3.51 MIL/uL — ABNORMAL LOW (ref 3.87–5.11)
RDW: 13.4 % (ref 11.5–15.5)
WBC: 8.9 10*3/uL (ref 4.0–10.5)
nRBC: 0 % (ref 0.0–0.2)

## 2019-10-22 LAB — FERRITIN: Ferritin: 11 ng/mL (ref 11–307)

## 2019-10-22 LAB — VITAMIN B12: Vitamin B-12: 168 pg/mL — ABNORMAL LOW (ref 180–914)

## 2019-10-22 LAB — APTT: aPTT: 32 seconds (ref 24–36)

## 2019-10-22 NOTE — Progress Notes (Signed)
Request for previous medical records and patient's signed authorization for disclosure faxed to [facility] [place], [place].

## 2019-10-22 NOTE — Progress Notes (Signed)
HEMATOLOGY/ONCOLOGY CONSULTATION NOTE  Date of Service: 10/22/2019  Patient Care Team: Patient, No Pcp Per as PCP - General (General Practice)  CHIEF COMPLAINTS/PURPOSE OF CONSULTATION:  Von Willebrand's disease  HISTORY OF PRESENTING ILLNESS:   Brittany Barker is a wonderful 29 y.o. female who has been referred to [place] by Dr Elly Modena for evaluation and management of Von Willebrand's disease. The pt reports that she is doing well overall.   The pt reports she was diagnosed with Von Willebrand's disease when she was 51. Pt was having heavy periods that lasted 10-14 days. She was in [place] and living in a group home at this time. Pt was then placed on Depo-Provera which lightened her periods. Pt was taking medication for her Von Willebrand's while in the home but has not taken any since. She is unaware of any other members in her family that have the disease. She saw a Hematologist in [place] in 2019 who told the pt that her Hgb was good and asked her to follow up in a year. She was also following with a PCP at [facility] in [place], [place].   She is currently [redacted] weeks pregnant. Pt has noticed some increased gum bleeding during pregnancy, but has always had some level of gum bleeding. Pt came home from jail in January of 2020 and was told that she had Hepatitis C. Pt has not needed any medication to treat her Hep C. She denies any intravenous drug usage or blood transfusions, but has a number of tattoos. Pt currently smokes cigarettes and uses marijuana. Pt previously has used cocaine. She has had teeth pulled and a D&C in 2015 and denies excessive bleeding with either of these procedures and needed no bleeding prophylaxis prior. She is only taking pre-natal vitamins at this time. She is not taking Protonix for her acid reflux or Zoloft for her depression as prescribed. She has noticed excessive bruising. Pt was taking a fair amount of Ibuprofen for her toothaches throughout her pregnancy but  recently discontinued its use.   Most recent lab results (09/14/2019) of CBC and CMET is as follows: all values are WNL except for RBC at 3.65, Hgb at 10.9, HCT at 31.7, Glucose at 105, BUN at 5, Albumin at 3.7.  On review of systems, pt reports gum bleeds, bruising and denies nose bleeds, brain bleeds, hemarthrosis, abdominal pain, leg swelling and any other symptoms.   On PMHx the pt reports Hepatitis C, Von Willebrand's disease, Tooth Extraction, D&C (2015), Menorrhagia, Anxiety, Depression.  On Social Hx the pt reports she is currently smoking marijuana and cigarettes. She has previously used cocaine.  On Family Hx the pt reports that no one one else in her family has Von Willebrand's to her knowledge.  MEDICAL HISTORY:  Past Medical History:  Diagnosis Date  . Hepatitis C   . Von Willebrand disease ([place])     SURGICAL HISTORY: Past Surgical History:  Procedure Laterality Date  . MOUTH SURGERY  2019    SOCIAL HISTORY: Social History   Socioeconomic History  . Marital status: Married    Spouse name: Not on file  . Number of children: Not on file  . Years of education: Not on file  . Highest education level: Not on file  Occupational History  . Not on file  Tobacco Use  . Smoking status: Current Every Day Smoker    Packs/day: 0.25    Types: Cigarettes  . Smokeless tobacco: Never Used  Substance and Sexual Activity  .  Alcohol use: Not Currently  . Drug use: Yes    Types: Marijuana    Comment: daily  . Sexual activity: Yes    Partners: Male    Birth control/protection: None  Other Topics Concern  . Not on file  Social History Narrative  . Not on file   Social Determinants of Health   Financial Resource Strain:   . Difficulty of Paying Living Expenses:   Food Insecurity:   . Worried About Charity fundraiser in the Last Year:   . Arboriculturist in the Last Year:   Transportation Needs:   . Film/video editor (Medical):   Marland Kitchen Lack of Transportation  (Non-Medical):   Physical Activity:   . Days of Exercise per Week:   . Minutes of Exercise per Session:   Stress:   . Feeling of Stress :   Social Connections:   . Frequency of Communication with Friends and Family:   . Frequency of Social Gatherings with Friends and Family:   . Attends Religious Services:   . Active Member of Clubs or Organizations:   . Attends Archivist Meetings:   Marland Kitchen Marital Status:   Intimate Partner Violence:   . Fear of Current or Ex-Partner:   . Emotionally Abused:   Marland Kitchen Physically Abused:   . Sexually Abused:     FAMILY HISTORY: Family History  Problem Relation Age of Onset  . Bipolar disorder Mother   . Hypertension Maternal Grandmother   . Colon cancer Maternal Grandmother     ALLERGIES:  is allergic to bee venom and cherry.  MEDICATIONS:  Current Outpatient Medications  Medication Sig Dispense Refill  . Ascorbic Acid (VITAMIN C) 1000 MG tablet Take 1,000 mg by mouth daily.    . Blood Pressure KIT 1 kit by Does not apply route daily. 1 kit 0  . Blood Pressure Monitoring (BLOOD PRESSURE KIT) DEVI 1 kit by Does not apply route once a week. Check Blood Pressure regularly and record readings into the [employer] App.  Large Cuff.  DX O90.0 1 each 0  . OVER THE COUNTER MEDICATION Take 1 Package by mouth daily as needed (sick).    . pantoprazole (PROTONIX) 40 MG tablet Take 1 tablet (40 mg total) by mouth daily. (Patient not taking: Reported on 10/11/2019) 30 tablet 3  . Prenatal Vit-Fe Fumarate-FA (MULTIVITAMIN-PRENATAL) 27-0.8 MG TABS tablet Take 1 tablet by mouth daily at 12 noon.    . sertraline (ZOLOFT) 50 MG tablet Take 1 tablet (50 mg total) by mouth daily. (Patient not taking: Reported on 10/11/2019) 30 tablet 2   No current facility-administered medications for this visit.    REVIEW OF SYSTEMS:    10 Point review of Systems was done is negative except as noted above.  PHYSICAL EXAMINATION: ECOG PERFORMANCE STATUS: 0 -  Asymptomatic  . Vitals:   10/22/19 1139  BP: 125/78  Pulse: 83  Resp: 18  Temp: 98.2 F (36.8 C)  SpO2: 100%   Filed Weights   10/22/19 1139  Weight: 238 lb 6.4 oz (108.1 kg)   .Body mass index is 37.34 kg/m.  GENERAL:alert, in no acute distress and comfortable SKIN: no acute rashes, no significant lesions EYES: conjunctiva are pink and non-injected, sclera anicteric OROPHARYNX: MMM, no exudates, no oropharyngeal erythema or ulceration NECK: supple, no JVD LYMPH:  no palpable lymphadenopathy in the cervical, axillary or inguinal regions LUNGS: clear to auscultation b/l with normal respiratory effort HEART: regular rate & rhythm ABDOMEN:  normoactive bowel sounds , non tender, not distended. Extremity: no pedal edema PSYCH: alert & oriented x 3 with fluent speech NEURO: no focal motor/sensory deficits  LABORATORY DATA:  I have reviewed the data as listed  . CBC Latest Ref Rng & Units 10/22/2019 09/14/2019  WBC 4.0 - 10.5 K/uL 8.9 10.1  Hemoglobin 12.0 - 15.0 g/dL 10.2(L) 10.9(L)  Hematocrit 36.0 - 46.0 % 31.4(L) 31.7(L)  Platelets 150 - 400 K/uL 237 226    . CMP Latest Ref Rng & Units 10/22/2019 09/14/2019  Glucose 70 - 99 mg/dL 71 105(H)  BUN 6 - 20 mg/dL 5(L) 5(L)  Creatinine 0.44 - 1.00 mg/dL 0.63 0.57  Sodium 135 - 145 mmol/L 138 139  Potassium 3.5 - 5.1 mmol/L 3.1(L) 3.5  Chloride 98 - 111 mmol/L 105 104  CO2 22 - 32 mmol/L 22 22  Calcium 8.9 - 10.3 mg/dL 8.4(L) 8.9  Total Protein 6.5 - 8.1 g/dL 6.7 6.4  Total Bilirubin 0.3 - 1.2 mg/dL 0.6 0.5  Alkaline Phos 38 - 126 U/L 62 56  AST 15 - 41 U/L 11(L) 14  ALT 0 - 44 U/L 9 16     RADIOGRAPHIC STUDIES: I have personally reviewed the radiological images as listed and agreed with the findings in the report.  ASSESSMENT & PLAN:   29 yo female who is current [redacted] weeks pregnant with   1) Possible h/o Von Willebrands disease Poor historian and cannot provide much information about his diagnosis and management  for his thus far. Notes that she has been been/needed treatment for VWD previous. Currently with some gum bleeding in the setting of poor dental hygiene and ibuprofen use. PLAN: -Discussed patient's most recent labs from 09/14/2019, all values are WNL except for RBC at 3.65, Hgb at 10.9, HCT at 31.7, Glucose at 105, BUN at 5, Albumin at 3.7. -Advised pt that Von Willebrand's is a blood clotting disorder, which carries a higher risk of bleeding - important for upcoming birth, especially if C-section is necessary  -Advised pt that the Von Willebrand's levels increase in pregnancy and current testing will not show baseline.  -Waiting to receive the rest of the pt's medical records to verify initial Von Willebrand's diagnosis  -Advised pt to avoid NSAIDs, which increase bleeding risk -Recommend pt f/u with Dr. Elly Modena for OBGYN cares -Will get labs today  -Will see back in 2 weeks via phone    FOLLOW UP: Labs today Phone visit with Dr Irene Limbo in 2 weeks  All of the patients questions were answered with apparent satisfaction. The patient knows to call the clinic with any problems, questions or concerns.  I spent 30 mins counseling the patient face to face. The total time spent in the appointment was 45 minutes and more than 50% was on counseling and direct patient cares.    Sullivan Lone MD [place] AAHIVMS [facility] Hematology/Oncology Physician [facility]  (Office):       [phone] (Work cell):  [phone] (Fax):           [phone]  10/22/2019 12:22 PM  I, Yevette Edwards, am acting as a scribe for Dr. Sullivan Lone.   .I have reviewed the above documentation for accuracy and completeness, and I agree with the above. Brunetta Genera MD

## 2019-10-23 LAB — VON WILLEBRAND PANEL
Coagulation Factor VIII: 158 % — ABNORMAL HIGH (ref 56–140)
Ristocetin Co-factor, Plasma: 42 % — ABNORMAL LOW (ref 50–200)
Von Willebrand Antigen, Plasma: 78 % (ref 50–200)

## 2019-10-23 LAB — COAG STUDIES INTERP REPORT

## 2019-10-23 NOTE — Progress Notes (Signed)
Received medical records from [facility]. HIM to scan into patient records.

## 2019-10-24 ENCOUNTER — Telehealth: Payer: Self-pay | Admitting: Hematology

## 2019-10-24 NOTE — Telephone Encounter (Signed)
Scheduled per los, patient has been called and notified. 

## 2019-10-29 ENCOUNTER — Other Ambulatory Visit: Payer: Self-pay

## 2019-10-29 ENCOUNTER — Ambulatory Visit (INDEPENDENT_AMBULATORY_CARE_PROVIDER_SITE_OTHER): Payer: Medicaid Other | Admitting: Obstetrics & Gynecology

## 2019-10-29 VITALS — BP 113/70 | HR 94 | Wt 240.3 lb

## 2019-10-29 DIAGNOSIS — Z3A33 33 weeks gestation of pregnancy: Secondary | ICD-10-CM

## 2019-10-29 DIAGNOSIS — F1221 Cannabis dependence, in remission: Secondary | ICD-10-CM

## 2019-10-29 DIAGNOSIS — O99113 Other diseases of the blood and blood-forming organs and certain disorders involving the immune mechanism complicating pregnancy, third trimester: Secondary | ICD-10-CM

## 2019-10-29 DIAGNOSIS — Z3480 Encounter for supervision of other normal pregnancy, unspecified trimester: Secondary | ICD-10-CM

## 2019-10-29 DIAGNOSIS — F1421 Cocaine dependence, in remission: Secondary | ICD-10-CM

## 2019-10-29 DIAGNOSIS — D6800 Von Willebrand disease, unspecified: Secondary | ICD-10-CM

## 2019-10-29 DIAGNOSIS — Z8659 Personal history of other mental and behavioral disorders: Secondary | ICD-10-CM

## 2019-10-29 DIAGNOSIS — O98413 Viral hepatitis complicating pregnancy, third trimester: Secondary | ICD-10-CM

## 2019-10-29 DIAGNOSIS — O133 Gestational [pregnancy-induced] hypertension without significant proteinuria, third trimester: Secondary | ICD-10-CM

## 2019-10-29 DIAGNOSIS — B1920 Unspecified viral hepatitis C without hepatic coma: Secondary | ICD-10-CM

## 2019-10-29 DIAGNOSIS — O99333 Smoking (tobacco) complicating pregnancy, third trimester: Secondary | ICD-10-CM

## 2019-10-29 DIAGNOSIS — F17200 Nicotine dependence, unspecified, uncomplicated: Secondary | ICD-10-CM

## 2019-10-29 NOTE — Progress Notes (Signed)
Patient reports fetal movement, denies pain. 

## 2019-10-29 NOTE — Progress Notes (Signed)
   PRENATAL VISIT NOTE  Subjective:  Brittany Barker is a 29 y.o. G2P0010 at [redacted]w[redacted]d being seen today for ongoing prenatal care.  She is currently monitored for the following issues for this high-risk pregnancy and has Encounter for supervision of other normal pregnancy, unspecified trimester; Hx of anxiety disorder; Viral hepatitis complicating pregnancy, third trimester; Von Willebrand disease (HCC); History of substance abuse (HCC); Tobacco use during pregnancy, antepartum; and Hx of bipolar disorder on their problem list.  Patient reports no complaints.  Contractions: Not present. Vag. Bleeding: None.  Movement: Present. Denies leaking of fluid.   The following portions of the patient's history were reviewed and updated as appropriate: allergies, current medications, past family history, past medical history, past social history, past surgical history and problem list.   Objective:   Vitals:   10/29/19 0901  BP: 113/70  Pulse: 94  Weight: 240 lb 4.8 oz (109 kg)    Fetal Status: Fetal Heart Rate (bpm): 140   Movement: Present     General:  Alert, oriented and cooperative. Patient is in no acute distress.  Skin: Skin is warm and dry. No rash noted.   Cardiovascular: Normal heart rate noted  Respiratory: Normal respiratory effort, no problems with respiration noted  Abdomen: Soft, gravid, appropriate for gestational age.  Pain/Pressure: Absent     Pelvic: Cervical exam deferred        Extremities: Normal range of motion.  Edema: Trace  Mental Status: Normal mood and affect. Normal behavior. Normal judgment and thought content.   Assessment and Plan:  Pregnancy: G2P0010 at [redacted]w[redacted]d 1. Encounter for supervision of other normal pregnancy, unspecified trimester Saw Hematology last week, VW labs reviewed  Preterm labor symptoms and general obstetric precautions including but not limited to vaginal bleeding, contractions, leaking of fluid and fetal movement were reviewed in detail with the  patient. Please refer to After Visit Summary for other counseling recommendations.   Return in about 3 weeks (around 11/19/2019) for GBS.  Future Appointments  Date Time Provider Department Center  11/08/2019 11:00 AM [facility] NURSE [facility]  11/08/2019 11:00 AM [facility] [place] 3 [facility]  11/09/2019 12:00 PM Johney Maine, MD [facility] None  11/19/2019 10:45 AM Constant, Gigi Gin, MD [facility] None    Scheryl Darter, MD

## 2019-10-29 NOTE — Patient Instructions (Signed)

## 2019-11-01 ENCOUNTER — Encounter: Payer: Self-pay | Admitting: *Deleted

## 2019-11-01 ENCOUNTER — Telehealth: Payer: Self-pay | Admitting: *Deleted

## 2019-11-01 NOTE — Telephone Encounter (Signed)
Contacted patient with information as per Dr. Candise Che in message below. Patient verbalized understanding.  Sent information regarding potassium rich foods to patient via my chart.

## 2019-11-01 NOTE — Telephone Encounter (Signed)
-----   Message from Johney Maine, MD sent at 10/28/2019 11:55 PM EDT ----- Plz let patient know her Potassium levels are low and that she should eat potassium rich foods and f/u with her Obgyn/PCP to have potassium levels rechecked in 2 weeks.

## 2019-11-06 ENCOUNTER — Encounter (HOSPITAL_COMMUNITY): Payer: Self-pay | Admitting: *Deleted

## 2019-11-06 ENCOUNTER — Ambulatory Visit (HOSPITAL_COMMUNITY)
Admission: RE | Admit: 2019-11-06 | Discharge: 2019-11-06 | Disposition: A | Discharge status: Home / Self Care | Payer: Medicaid Other | Source: Ambulatory Visit | Attending: Obstetrics and Gynecology | Admitting: Obstetrics and Gynecology

## 2019-11-06 ENCOUNTER — Ambulatory Visit (HOSPITAL_COMMUNITY): Payer: Medicaid Other | Admitting: *Deleted

## 2019-11-06 ENCOUNTER — Other Ambulatory Visit: Payer: Self-pay

## 2019-11-06 VITALS — BP 128/69 | HR 81 | Temp 97.5°F

## 2019-11-06 DIAGNOSIS — Z3A34 34 weeks gestation of pregnancy: Secondary | ICD-10-CM

## 2019-11-06 DIAGNOSIS — F17200 Nicotine dependence, unspecified, uncomplicated: Secondary | ICD-10-CM | POA: Diagnosis present

## 2019-11-06 DIAGNOSIS — O99333 Smoking (tobacco) complicating pregnancy, third trimester: Secondary | ICD-10-CM | POA: Diagnosis not present

## 2019-11-06 DIAGNOSIS — Z362 Encounter for other antenatal screening follow-up: Secondary | ICD-10-CM | POA: Diagnosis present

## 2019-11-06 DIAGNOSIS — O2690 Pregnancy related conditions, unspecified, unspecified trimester: Secondary | ICD-10-CM | POA: Diagnosis not present

## 2019-11-08 ENCOUNTER — Ambulatory Visit (HOSPITAL_COMMUNITY): Payer: Medicaid Other

## 2019-11-08 NOTE — Progress Notes (Signed)
HEMATOLOGY/ONCOLOGY CLINIC NOTE  Date of Service: 11/09/2019  Patient Care Team: Patient, No Pcp Per as PCP - General (General Practice)  CHIEF COMPLAINTS/PURPOSE OF CONSULTATION:  Von Willebrand's disease  HISTORY OF PRESENTING ILLNESS:   I connected with Brittany Barker on 11/09/19 at 12:00 PM EDT and verified that I am speaking with the correct person using two identifiers.   I discussed the limitations, risks, security and privacy concerns of performing an evaluation and management service by telemedicine and the availability of in-person appointments. I also discussed with the patient that there may be a patient responsible charge related to this service. The patient expressed understanding and agreed to proceed.   Other persons participating in the visit and their role in the encounter:     -Dawayne Cirri, Medical Scribe    Patient's location: Home  Provider's location: [place] at [place]   Brittany Barker is a wonderful 29 y.o. female who has been referred to [place] by Dr Elly Modena for evaluation and management of Von Willebrand's disease. The patient's last visit with [place] was on 10/22/19. The pt reports that she is doing well overall.  The pt reports she is well. Pt reports that she needs to get Potassium levels checked by OB/GYN. There was no concerns of excessive uterine bleeding after D&C. She has had dental surgeries and no excessive bleeding. Pt is no longer taking zoloft or ibuprofen. She will be moving to [place] in 60 days. Pt is taking prenatal vitamins. She sometimes has trouble with constipation depending on what she eats.   Lab results today (10/22/19) of CBC w/diff and CMP is as follows: all values are WNL except for RBC at 3.51, Hemoglobin at 10.2, HCT at 31.4, Potassium at 3.1, BUN at 5, Calcium at 8.4, Albumin at 3.1, AST at 11. 10/22/19 of Platelet Function Assay is as follows: all values are WNL except: Collagen/ADP at >300, Collagen/ Epinephrine at >300 10/22/19 of  Ferritin at 11 10/22/19 of APTT at 32 10/22/19 of Protime-INR is as follows: all values are WNL 10/22/19 of Von Willebrand Panel is as follows: all values are WNL except for: Coagulation Factor VIII at 158, Ristocetin Co-Factor, Plasma at 42, Von willebrand Ag level 68%. VWF multimer analysis demonstrates a normal pattern and  distribution of bands. 10/22/19 of Vitamin B12 at 168   MEDICAL HISTORY:  Past Medical History:  Diagnosis Date  . Hepatitis C   . Von Willebrand disease ([place])     SURGICAL HISTORY: Past Surgical History:  Procedure Laterality Date  . MOUTH SURGERY  2019    SOCIAL HISTORY: Social History   Socioeconomic History  . Marital status: Married    Spouse name: Not on file  . Number of children: Not on file  . Years of education: Not on file  . Highest education level: Not on file  Occupational History  . Not on file  Tobacco Use  . Smoking status: Current Every Day Smoker    Packs/day: 0.25    Types: Cigarettes  . Smokeless tobacco: Never Used  Substance and Sexual Activity  . Alcohol use: Not Currently  . Drug use: Yes    Types: Marijuana    Comment: daily  . Sexual activity: Yes    Partners: Male    Birth control/protection: None  Other Topics Concern  . Not on file  Social History Narrative  . Not on file   Social Determinants of Health   Financial Resource Strain:   . Difficulty of Paying  Living Expenses:   Food Insecurity:   . Worried About Charity fundraiser in the Last Year:   . Arboriculturist in the Last Year:   Transportation Needs:   . Film/video editor (Medical):   Marland Kitchen Lack of Transportation (Non-Medical):   Physical Activity:   . Days of Exercise per Week:   . Minutes of Exercise per Session:   Stress:   . Feeling of Stress :   Social Connections:   . Frequency of Communication with Friends and Family:   . Frequency of Social Gatherings with Friends and Family:   . Attends Religious Services:   . Active Member of  Clubs or Organizations:   . Attends Archivist Meetings:   Marland Kitchen Marital Status:   Intimate Partner Violence:   . Fear of Current or Ex-Partner:   . Emotionally Abused:   Marland Kitchen Physically Abused:   . Sexually Abused:     FAMILY HISTORY: Family History  Problem Relation Age of Onset  . Bipolar disorder Mother   . Hypertension Maternal Grandmother   . Colon cancer Maternal Grandmother     ALLERGIES:  is allergic to bee venom and cherry.  MEDICATIONS:  Current Outpatient Medications  Medication Sig Dispense Refill  . Ascorbic Acid (VITAMIN C) 1000 MG tablet Take 1,000 mg by mouth daily.    . Blood Pressure KIT 1 kit by Does not apply route daily. 1 kit 0  . Blood Pressure Monitoring (BLOOD PRESSURE KIT) DEVI 1 kit by Does not apply route once a week. Check Blood Pressure regularly and record readings into the [employer] App.  Large Cuff.  DX O90.0 1 each 0  . calcium carbonate (TUMS - DOSED IN MG ELEMENTAL CALCIUM) 500 MG chewable tablet Chew 1 tablet by mouth daily.    . Cyanocobalamin (B-12) 1000 MCG SUBL Place 1,000 mcg under the tongue daily. 30 tablet 4  . iron polysaccharides (NIFEREX) 150 MG capsule Take 1 capsule (150 mg total) by mouth daily. 60 capsule 3  . OVER THE COUNTER MEDICATION Take 1 Package by mouth daily as needed (sick).    . pantoprazole (PROTONIX) 40 MG tablet Take 1 tablet (40 mg total) by mouth daily. (Patient not taking: Reported on 10/11/2019) 30 tablet 3  . Prenatal Vit-Fe Fumarate-FA (MULTIVITAMIN-PRENATAL) 27-0.8 MG TABS tablet Take 1 tablet by mouth daily at 12 noon.    . sertraline (ZOLOFT) 50 MG tablet Take 1 tablet (50 mg total) by mouth daily. (Patient not taking: Reported on 10/11/2019) 30 tablet 2   No current facility-administered medications for this visit.    REVIEW OF SYSTEMS:   A 10+ POINT REVIEW OF SYSTEMS WAS OBTAINED including neurology, dermatology, psychiatry, cardiac, respiratory, lymph, extremities, GI, GU, Musculoskeletal,  constitutional, breasts, reproductive, HEENT.  All pertinent positives are noted in the HPI.  All others are negative.   PHYSICAL EXAMINATION: ECOG PERFORMANCE STATUS: 0 - Asymptomatic  Telehealth Visit   LABORATORY DATA:  I have reviewed the data as listed  . CBC Latest Ref Rng & Units 11/14/2019 10/22/2019 09/14/2019  WBC 3.4 - 10.8 x10E3/uL 11.3(H) 8.9 10.1  Hemoglobin 11.1 - 15.9 g/dL 10.5(L) 10.2(L) 10.9(L)  Hematocrit 34.0 - 46.6 % 30.6(L) 31.4(L) 31.7(L)  Platelets 150 - 450 x10E3/uL 214 237 226    . CMP Latest Ref Rng & Units 11/14/2019 10/22/2019 09/14/2019  Glucose 65 - 99 mg/dL 74 71 105(H)  BUN 6 - 20 mg/dL 5(L) 5(L) 5(L)  Creatinine 0.57 - 1.00 mg/dL  0.60 0.63 0.57  Sodium 134 - 144 mmol/L 137 138 139  Potassium 3.5 - 5.2 mmol/L 4.0 3.1(L) 3.5  Chloride 96 - 106 mmol/L 102 105 104  CO2 20 - 29 mmol/L 22 22 22   Calcium 8.7 - 10.2 mg/dL 8.8 8.4(L) 8.9  Total Protein 6.0 - 8.5 g/dL 6.7 6.7 6.4  Total Bilirubin 0.0 - 1.2 mg/dL 0.4 0.6 0.5  Alkaline Phos 39 - 117 IU/L 77 62 56  AST 0 - 40 IU/L 16 11(L) 14  ALT 0 - 32 IU/L 10 9 16    Component     Latest Ref Rng & Units 10/22/2019  Coagulation Factor VIII     56 - 140 % 158 (H)  Ristocetin Co-factor, Plasma     50 - 200 % 42 (L)  Von Willebrand Antigen, Plasma     50 - 200 % 78  Collagen / ADP     0 - 118 seconds >300 (H)  PFA Interpretation        Collagen / Epinephrine     0 - 193 seconds >300 (H)  Von Willebrand Multimers      Comment  Ferritin     11 - 307 ng/mL 11  Vitamin B12     180 - 914 pg/mL 168 (L)    RADIOGRAPHIC STUDIES: I have personally reviewed the radiological images as listed and agreed with the findings in the report.  ASSESSMENT & PLAN:   29 yo female who is current [redacted] weeks pregnant with   1) Possible h/o Von Willebrands disease Poor historian and cannot provide much information about his diagnosis and management for his thus far. Notes that she has never been treated or needed  treatment for VWD previously. Currently with some gum bleeding in the setting of poor dental hygiene and ibuprofen use.  2) Iron deficiency Anemia likely related to pregnancy  3) B12 deficiency - likely due to pregnancy.  4) Chronic Hep C  PLAN: -Discussed pt labwork today, 10/22/19; of CBC w/diff and CMP is as follows: all values are WNL except for RBC at 3.51, Hemoglobin at 10.2, HCT at 31.4, Potassium at 3.1, BUN at 5, Calcium at 8.4, Albumin at 3.1, AST at 11. -Discussed 10/22/19 of Platelet Function Assay is as follows: all values are WNL except: Collagen/ADP at >300, Collagen/ Epinephrine at >300 -Discussed 10/22/19 of Ferritin at 11 -Discussed 10/22/19 of APTT at 32 -Discussed 10/22/19 of Protime-INR is as follows: all values are WNL -Discussed 10/22/19 of Von Willebrand Panel is as follows: Coagulation Factor VIII at 158, Ristocetin Co-Factor, Plasma at 42, VW Ag levels WNL @ 78%. -Discussed 10/22/19 of Vitamin B12 at 168 -Advised pt that Von Willebrand's is a blood clotting disorder, which carries a higher risk of bleeding - important for upcoming birth, especially if C-section is necessary  -Advised pt that the Von Willebrand's levels increase in pregnancy and current testing will not show baseline and makes it difficult to definitively diagnose her with VWD. 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  -no ddAVP stimulation testing done/available. -Advised while pregnant that Von Willebrand's levels are heightened and drop after delivery and might increase risk of bleeding. -Advised do not know baseline levels- can re-check after delivery  -Advised potential mild Von Willebrand's disease - needs to be confirmed 2-3 months after delivering  -If neuraxial analgesia or anesthesia or C section needed -- will need to correct VWF levels /R-CO actiivity levels to 100%  I.e (Humate p -loading dose - VWF:RCo 30 units/kg 1 hour prior to  procedure/surgery . If C -section - will need maintenance dose of 20 units/kg q12h for 3 days then can consider lysteda 650gm po TID for additional 7-10 days if not breast feeding). -if vaginal delivery would not need factor replacement unless significant Post partum bleeding. Could consider lysteda post-delivery 622m po Tid if not breast feeding. -Advised on Hepatitis C treatment -Advised iron deficient and Vitamin B12 deficient  -[place]  -Recommend pt f/u with Dr. CElly Modenafor OBGYN cares -Recommends Oral Iron supplement iron polysaccharide 1 pill daily, 2 pills daily if tolerating well  -Recommends OTC vitamin B12 1000 mcg once a day -Recommends continuing prenatal vitamins -Recommends no NSAID's  -Recommends eating food's high in potassium -F/u with PCP about iron levels   FOLLOW UP: RTC with   The total time spent in the appt was 40 minutes and more than 50% was on counseling and direct patient cares.  All of the patient's questions were answered with apparent satisfaction. The patient knows to call the clinic with any problems, questions or concerns.   GSullivan LoneMD MS AAHIVMS SSouthern Maine Medical CenterCCommunity Hospital Of AnacondaHematology/Oncology Physician CThe [facility] (Office):       [phone](Work cell):  [phone](Fax):           [phone] 11/09/2019 1:34 PM  I, EDawayne Cirriam acting as a sEducation administratorfor Dr. GSullivan Lone   .I have reviewed the above documentation for accuracy and completeness, and I agree with the above. .Brunetta GeneraMD

## 2019-11-09 ENCOUNTER — Inpatient Hospital Stay: Payer: Medicaid Other | Attending: Hematology | Admitting: Hematology

## 2019-11-09 DIAGNOSIS — O98413 Viral hepatitis complicating pregnancy, third trimester: Secondary | ICD-10-CM | POA: Diagnosis not present

## 2019-11-09 DIAGNOSIS — F17210 Nicotine dependence, cigarettes, uncomplicated: Secondary | ICD-10-CM | POA: Insufficient documentation

## 2019-11-09 DIAGNOSIS — O99013 Anemia complicating pregnancy, third trimester: Secondary | ICD-10-CM | POA: Diagnosis not present

## 2019-11-09 DIAGNOSIS — D509 Iron deficiency anemia, unspecified: Secondary | ICD-10-CM | POA: Diagnosis not present

## 2019-11-09 DIAGNOSIS — E538 Deficiency of other specified B group vitamins: Secondary | ICD-10-CM | POA: Insufficient documentation

## 2019-11-09 DIAGNOSIS — B182 Chronic viral hepatitis C: Secondary | ICD-10-CM | POA: Insufficient documentation

## 2019-11-09 DIAGNOSIS — Z79899 Other long term (current) drug therapy: Secondary | ICD-10-CM | POA: Diagnosis not present

## 2019-11-09 DIAGNOSIS — O99283 Endocrine, nutritional and metabolic diseases complicating pregnancy, third trimester: Secondary | ICD-10-CM | POA: Diagnosis not present

## 2019-11-09 DIAGNOSIS — F1210 Cannabis abuse, uncomplicated: Secondary | ICD-10-CM | POA: Diagnosis not present

## 2019-11-09 DIAGNOSIS — D6800 Von Willebrand disease, unspecified: Secondary | ICD-10-CM

## 2019-11-09 DIAGNOSIS — O99113 Other diseases of the blood and blood-forming organs and certain disorders involving the immune mechanism complicating pregnancy, third trimester: Secondary | ICD-10-CM | POA: Insufficient documentation

## 2019-11-09 MED ORDER — B-12 1000 MCG SL SUBL: 1000.0000 ug | SUBLINGUAL_TABLET | Freq: Every day | SUBLINGUAL | 4 refills | Status: AC

## 2019-11-09 MED ORDER — POLYSACCHARIDE IRON COMPLEX 150 MG PO CAPS: 150.0000 mg | ORAL_CAPSULE | Freq: Every day | ORAL | 3 refills | Status: AC

## 2019-11-14 ENCOUNTER — Other Ambulatory Visit: Payer: Self-pay

## 2019-11-14 ENCOUNTER — Other Ambulatory Visit (HOSPITAL_COMMUNITY)
Admission: RE | Admit: 2019-11-14 | Discharge: 2019-11-14 | Disposition: A | Discharge status: Home / Self Care | Payer: Medicaid Other | Source: Ambulatory Visit | Attending: Obstetrics & Gynecology | Admitting: Obstetrics & Gynecology

## 2019-11-14 ENCOUNTER — Ambulatory Visit (INDEPENDENT_AMBULATORY_CARE_PROVIDER_SITE_OTHER): Payer: Medicaid Other | Admitting: Obstetrics & Gynecology

## 2019-11-14 ENCOUNTER — Telehealth: Payer: Self-pay

## 2019-11-14 VITALS — BP 134/82 | HR 88 | Wt 238.3 lb

## 2019-11-14 DIAGNOSIS — B1920 Unspecified viral hepatitis C without hepatic coma: Secondary | ICD-10-CM

## 2019-11-14 DIAGNOSIS — O99413 Diseases of the circulatory system complicating pregnancy, third trimester: Secondary | ICD-10-CM

## 2019-11-14 DIAGNOSIS — Z3480 Encounter for supervision of other normal pregnancy, unspecified trimester: Secondary | ICD-10-CM | POA: Diagnosis present

## 2019-11-14 DIAGNOSIS — O133 Gestational [pregnancy-induced] hypertension without significant proteinuria, third trimester: Secondary | ICD-10-CM

## 2019-11-14 DIAGNOSIS — D680 Von Willebrand's disease: Secondary | ICD-10-CM

## 2019-11-14 DIAGNOSIS — F1221 Cannabis dependence, in remission: Secondary | ICD-10-CM

## 2019-11-14 DIAGNOSIS — F17200 Nicotine dependence, unspecified, uncomplicated: Secondary | ICD-10-CM

## 2019-11-14 DIAGNOSIS — O99113 Other diseases of the blood and blood-forming organs and certain disorders involving the immune mechanism complicating pregnancy, third trimester: Secondary | ICD-10-CM

## 2019-11-14 DIAGNOSIS — Z8659 Personal history of other mental and behavioral disorders: Secondary | ICD-10-CM

## 2019-11-14 DIAGNOSIS — Z3A35 35 weeks gestation of pregnancy: Secondary | ICD-10-CM

## 2019-11-14 DIAGNOSIS — F1421 Cocaine dependence, in remission: Secondary | ICD-10-CM

## 2019-11-14 DIAGNOSIS — O99333 Smoking (tobacco) complicating pregnancy, third trimester: Secondary | ICD-10-CM

## 2019-11-14 LAB — VON WILLEBRAND FACTOR MULTIMER

## 2019-11-14 NOTE — Patient Instructions (Signed)

## 2019-11-14 NOTE — Telephone Encounter (Signed)
Returned call and pt states that she has a dime sized not that came up close to her belly button a few days ago. Pt reports that it has been causing her pain with straining activities, such as coughing, bowel movements etc. and she has been nauseated since it started. Pt would like to be evaluated, notified scheduler for appt.

## 2019-11-14 NOTE — Progress Notes (Signed)
   PRENATAL VISIT NOTE  Subjective:  Brittany Barker is a 29 y.o. G2P0010 at [redacted]w[redacted]d being seen today for ongoing prenatal care.  She is currently monitored for the following issues for this high-risk pregnancy and has Encounter for supervision of other normal pregnancy, unspecified trimester; Hx of anxiety disorder; Viral hepatitis complicating pregnancy, third trimester; Von Willebrand disease (HCC); History of substance abuse (HCC); Tobacco use during pregnancy, antepartum; and Hx of bipolar disorder on their problem list.  Patient reports occasional contractions and pressure.  Contractions: Not present. Vag. Bleeding: None.  Movement: Present. Denies leaking of fluid.   The following portions of the patient's history were reviewed and updated as appropriate: allergies, current medications, past family history, past medical history, past social history, past surgical history and problem list.   Objective:   Vitals:   11/14/19 1451 11/14/19 1457  BP: (!) 141/90 134/82  Pulse: (!) 101 88  Weight: 238 lb 4.8 oz (108.1 kg)     Fetal Status: Fetal Heart Rate (bpm): 150 Fundal Height: 36 cm Movement: Present  Presentation: Vertex  General:  Alert, oriented and cooperative. Patient is in no acute distress.  Skin: Skin is warm and dry. No rash noted.   Cardiovascular: Normal heart rate noted  Respiratory: Normal respiratory effort, no problems with respiration noted  Abdomen: Soft, gravid, appropriate for gestational age.  Pain/Pressure: Absent     Pelvic: Cervical exam performed in the presence of a chaperone        Extremities: Normal range of motion.  Edema: Trace  Mental Status: Normal mood and affect. Normal behavior. Normal judgment and thought content.   Assessment and Plan:  Pregnancy: G2P0010 at [redacted]w[redacted]d 1. Encounter for supervision of other normal pregnancy, unspecified trimester routine - Strep Gp B NAA - Cervicovaginal ancillary only( Friend) - CBC - Comprehensive metabolic  panel - Protein / creatinine ratio, urine  2. Gestational hypertension, third trimester BP elevation noted, state 140s/80's at home, labs ordered  Preterm labor symptoms and general obstetric precautions including but not limited to vaginal bleeding, contractions, leaking of fluid and fetal movement were reviewed in detail with the patient. Please refer to After Visit Summary for other counseling recommendations.  BP precautions given  Return in about 5 days (around 11/19/2019).   No future appointments.  Scheryl Darter, MD

## 2019-11-14 NOTE — Progress Notes (Signed)
Pt is here for ROB, [redacted]w[redacted]d.

## 2019-11-15 LAB — CBC
Hematocrit: 30.6 % — ABNORMAL LOW (ref 34.0–46.6)
Hemoglobin: 10.5 g/dL — ABNORMAL LOW (ref 11.1–15.9)
MCH: 29.4 pg (ref 26.6–33.0)
MCHC: 34.3 g/dL (ref 31.5–35.7)
MCV: 86 fL (ref 79–97)
Platelets: 214 10*3/uL (ref 150–450)
RBC: 3.57 x10E6/uL — ABNORMAL LOW (ref 3.77–5.28)
RDW: 13.1 % (ref 11.7–15.4)
WBC: 11.3 10*3/uL — ABNORMAL HIGH (ref 3.4–10.8)

## 2019-11-15 LAB — COMPREHENSIVE METABOLIC PANEL
ALT: 10 IU/L (ref 0–32)
AST: 16 IU/L (ref 0–40)
Albumin/Globulin Ratio: 1.3 (ref 1.2–2.2)
Albumin: 3.8 g/dL — ABNORMAL LOW (ref 3.9–5.0)
Alkaline Phosphatase: 77 IU/L (ref 39–117)
BUN/Creatinine Ratio: 8 — ABNORMAL LOW (ref 9–23)
BUN: 5 mg/dL — ABNORMAL LOW (ref 6–20)
Bilirubin Total: 0.4 mg/dL (ref 0.0–1.2)
CO2: 22 mmol/L (ref 20–29)
Calcium: 8.8 mg/dL (ref 8.7–10.2)
Chloride: 102 mmol/L (ref 96–106)
Creatinine, Ser: 0.6 mg/dL (ref 0.57–1.00)
GFR calc Af Amer: 142 mL/min/{1.73_m2} (ref >59)
GFR calc non Af Amer: 124 mL/min/{1.73_m2} (ref >59)
Globulin, Total: 2.9 g/dL (ref 1.5–4.5)
Glucose: 74 mg/dL (ref 65–99)
Potassium: 4 mmol/L (ref 3.5–5.2)
Sodium: 137 mmol/L (ref 134–144)
Total Protein: 6.7 g/dL (ref 6.0–8.5)

## 2019-11-15 LAB — PROTEIN / CREATININE RATIO, URINE
Creatinine, Urine: 128.6 mg/dL
Protein, Ur: 18.8 mg/dL
Protein/Creat Ratio: 146 mg/g creat (ref 0–200)

## 2019-11-16 LAB — CERVICOVAGINAL ANCILLARY ONLY
Chlamydia: NEGATIVE
Comment: NEGATIVE
Comment: NORMAL
Neisseria Gonorrhea: NEGATIVE

## 2019-11-16 LAB — STREP GP B NAA: Strep Gp B NAA: NEGATIVE

## 2019-11-19 ENCOUNTER — Other Ambulatory Visit: Payer: Self-pay

## 2019-11-19 ENCOUNTER — Ambulatory Visit (INDEPENDENT_AMBULATORY_CARE_PROVIDER_SITE_OTHER): Payer: Medicaid Other | Admitting: Obstetrics and Gynecology

## 2019-11-19 ENCOUNTER — Encounter: Payer: Medicaid Other | Admitting: Obstetrics and Gynecology

## 2019-11-19 ENCOUNTER — Encounter: Payer: Self-pay | Admitting: Obstetrics and Gynecology

## 2019-11-19 VITALS — BP 119/72 | HR 88 | Wt 242.0 lb

## 2019-11-19 DIAGNOSIS — F1221 Cannabis dependence, in remission: Secondary | ICD-10-CM

## 2019-11-19 DIAGNOSIS — O134 Gestational [pregnancy-induced] hypertension without significant proteinuria, complicating childbirth: Secondary | ICD-10-CM

## 2019-11-19 DIAGNOSIS — O99333 Smoking (tobacco) complicating pregnancy, third trimester: Secondary | ICD-10-CM

## 2019-11-19 DIAGNOSIS — Z3480 Encounter for supervision of other normal pregnancy, unspecified trimester: Secondary | ICD-10-CM

## 2019-11-19 DIAGNOSIS — D6800 Von Willebrand disease, unspecified: Secondary | ICD-10-CM

## 2019-11-19 DIAGNOSIS — O99113 Other diseases of the blood and blood-forming organs and certain disorders involving the immune mechanism complicating pregnancy, third trimester: Secondary | ICD-10-CM

## 2019-11-19 DIAGNOSIS — Z3A36 36 weeks gestation of pregnancy: Secondary | ICD-10-CM

## 2019-11-19 DIAGNOSIS — O98413 Viral hepatitis complicating pregnancy, third trimester: Secondary | ICD-10-CM

## 2019-11-19 DIAGNOSIS — F1421 Cocaine dependence, in remission: Secondary | ICD-10-CM

## 2019-11-19 DIAGNOSIS — B1920 Unspecified viral hepatitis C without hepatic coma: Secondary | ICD-10-CM

## 2019-11-19 DIAGNOSIS — F17200 Nicotine dependence, unspecified, uncomplicated: Secondary | ICD-10-CM

## 2019-11-19 DIAGNOSIS — Z8659 Personal history of other mental and behavioral disorders: Secondary | ICD-10-CM

## 2019-11-19 NOTE — Progress Notes (Signed)
   PRENATAL VISIT NOTE  Subjective:  Brittany Barker is a 29 y.o. G2P0010 at [redacted]w[redacted]d being seen today for ongoing prenatal care.  She is currently monitored for the following issues for this high-risk pregnancy and has Encounter for supervision of other normal pregnancy, unspecified trimester; Hx of anxiety disorder; Viral hepatitis complicating pregnancy, third trimester; Von Willebrand disease (HCC); History of substance abuse (HCC); Tobacco use during pregnancy, antepartum; and Hx of bipolar disorder on their problem list.  Patient reports no complaints.  Contractions: Not present. Vag. Bleeding: None.  Movement: Present. Denies leaking of fluid.   The following portions of the patient's history were reviewed and updated as appropriate: allergies, current medications, past family history, past medical history, past social history, past surgical history and problem list.   Objective:   Vitals:   11/19/19 1036  BP: 119/72  Pulse: 88  Weight: 242 lb (109.8 kg)    Fetal Status: Fetal Heart Rate (bpm): 150 Fundal Height: 36 cm Movement: Present  Presentation: Vertex  General:  Alert, oriented and cooperative. Patient is in no acute distress.  Skin: Skin is warm and dry. No rash noted.   Cardiovascular: Normal heart rate noted  Respiratory: Normal respiratory effort, no problems with respiration noted  Abdomen: Soft, gravid, appropriate for gestational age.  Pain/Pressure: Present     Pelvic: Cervical exam performed in the presence of a chaperone Dilation: Closed Effacement (%): 30 Station: Ballotable  Extremities: Normal range of motion.  Edema: Trace  Mental Status: Normal mood and affect. Normal behavior. Normal judgment and thought content.   Assessment and Plan:  Pregnancy: G2P0010 at [redacted]w[redacted]d 1. Encounter for supervision of other normal pregnancy, unspecified trimester Patient is doing well without complaints GBS negative 11/14/19 Normotensive today without symptoms. Patient advised to  bring BP cuff at next visit for calibration. Will continue to monitor closely  2. Viral hepatitis complicating pregnancy, third trimester   3. Von Willebrand disease (HCC) Seen by Hematology  Preterm labor symptoms and general obstetric precautions including but not limited to vaginal bleeding, contractions, leaking of fluid and fetal movement were reviewed in detail with the patient. Please refer to After Visit Summary for other counseling recommendations.   Return in about 1 week (around 11/26/2019) for ROB, in person, Low risk.  No future appointments.  Catalina Antigua, MD

## 2019-11-26 ENCOUNTER — Encounter (HOSPITAL_COMMUNITY): Payer: Self-pay | Admitting: Obstetrics and Gynecology

## 2019-11-26 ENCOUNTER — Other Ambulatory Visit: Payer: Self-pay

## 2019-11-26 ENCOUNTER — Inpatient Hospital Stay (HOSPITAL_COMMUNITY)
Admission: AD | Admit: 2019-11-26 | Discharge: 2019-11-30 | DRG: 806 | Disposition: A | Discharge status: Home / Self Care | Payer: Medicaid Other | Attending: Family Medicine | Admitting: Family Medicine

## 2019-11-26 ENCOUNTER — Ambulatory Visit (INDEPENDENT_AMBULATORY_CARE_PROVIDER_SITE_OTHER): Payer: Medicaid Other | Admitting: Advanced Practice Midwife

## 2019-11-26 VITALS — BP 155/80 | HR 94 | Wt 238.0 lb

## 2019-11-26 DIAGNOSIS — F1290 Cannabis use, unspecified, uncomplicated: Secondary | ICD-10-CM | POA: Diagnosis present

## 2019-11-26 DIAGNOSIS — Z3A37 37 weeks gestation of pregnancy: Secondary | ICD-10-CM | POA: Diagnosis not present

## 2019-11-26 DIAGNOSIS — F17210 Nicotine dependence, cigarettes, uncomplicated: Secondary | ICD-10-CM | POA: Diagnosis present

## 2019-11-26 DIAGNOSIS — O99324 Drug use complicating childbirth: Secondary | ICD-10-CM | POA: Diagnosis present

## 2019-11-26 DIAGNOSIS — Z8659 Personal history of other mental and behavioral disorders: Secondary | ICD-10-CM | POA: Diagnosis not present

## 2019-11-26 DIAGNOSIS — B1920 Unspecified viral hepatitis C without hepatic coma: Secondary | ICD-10-CM

## 2019-11-26 DIAGNOSIS — O134 Gestational [pregnancy-induced] hypertension without significant proteinuria, complicating childbirth: Secondary | ICD-10-CM | POA: Diagnosis present

## 2019-11-26 DIAGNOSIS — Z20822 Contact with and (suspected) exposure to covid-19: Secondary | ICD-10-CM | POA: Diagnosis present

## 2019-11-26 DIAGNOSIS — O9842 Viral hepatitis complicating childbirth: Secondary | ICD-10-CM | POA: Diagnosis present

## 2019-11-26 DIAGNOSIS — O99214 Obesity complicating childbirth: Secondary | ICD-10-CM | POA: Diagnosis present

## 2019-11-26 DIAGNOSIS — D6800 Von Willebrand disease, unspecified: Secondary | ICD-10-CM | POA: Diagnosis present

## 2019-11-26 DIAGNOSIS — F1911 Other psychoactive substance abuse, in remission: Secondary | ICD-10-CM | POA: Diagnosis present

## 2019-11-26 DIAGNOSIS — O99334 Smoking (tobacco) complicating childbirth: Secondary | ICD-10-CM | POA: Diagnosis present

## 2019-11-26 DIAGNOSIS — O9912 Other diseases of the blood and blood-forming organs and certain disorders involving the immune mechanism complicating childbirth: Secondary | ICD-10-CM | POA: Diagnosis present

## 2019-11-26 DIAGNOSIS — O98413 Viral hepatitis complicating pregnancy, third trimester: Secondary | ICD-10-CM | POA: Diagnosis present

## 2019-11-26 DIAGNOSIS — Z3480 Encounter for supervision of other normal pregnancy, unspecified trimester: Secondary | ICD-10-CM

## 2019-11-26 DIAGNOSIS — O133 Gestational [pregnancy-induced] hypertension without significant proteinuria, third trimester: Secondary | ICD-10-CM

## 2019-11-26 DIAGNOSIS — E669 Obesity, unspecified: Secondary | ICD-10-CM | POA: Diagnosis present

## 2019-11-26 DIAGNOSIS — F17200 Nicotine dependence, unspecified, uncomplicated: Secondary | ICD-10-CM

## 2019-11-26 DIAGNOSIS — F1421 Cocaine dependence, in remission: Secondary | ICD-10-CM

## 2019-11-26 DIAGNOSIS — F1221 Cannabis dependence, in remission: Secondary | ICD-10-CM

## 2019-11-26 DIAGNOSIS — O99330 Smoking (tobacco) complicating pregnancy, unspecified trimester: Secondary | ICD-10-CM | POA: Diagnosis present

## 2019-11-26 DIAGNOSIS — O99333 Smoking (tobacco) complicating pregnancy, third trimester: Secondary | ICD-10-CM

## 2019-11-26 DIAGNOSIS — O99113 Other diseases of the blood and blood-forming organs and certain disorders involving the immune mechanism complicating pregnancy, third trimester: Secondary | ICD-10-CM

## 2019-11-26 DIAGNOSIS — O99413 Diseases of the circulatory system complicating pregnancy, third trimester: Secondary | ICD-10-CM

## 2019-11-26 HISTORY — DX: Essential (primary) hypertension: I10

## 2019-11-26 LAB — CBC
HCT: 32.8 % — ABNORMAL LOW (ref 36.0–46.0)
Hemoglobin: 10.6 g/dL — ABNORMAL LOW (ref 12.0–15.0)
MCH: 29.4 pg (ref 26.0–34.0)
MCHC: 32.3 g/dL (ref 30.0–36.0)
MCV: 90.9 fL (ref 80.0–100.0)
Platelets: 245 10*3/uL (ref 150–400)
RBC: 3.61 MIL/uL — ABNORMAL LOW (ref 3.87–5.11)
RDW: 13.7 % (ref 11.5–15.5)
WBC: 11.8 10*3/uL — ABNORMAL HIGH (ref 4.0–10.5)
nRBC: 0 % (ref 0.0–0.2)

## 2019-11-26 LAB — COMPREHENSIVE METABOLIC PANEL
ALT: 19 U/L (ref 0–44)
AST: 17 U/L (ref 15–41)
Albumin: 3 g/dL — ABNORMAL LOW (ref 3.5–5.0)
Alkaline Phosphatase: 73 U/L (ref 38–126)
Anion gap: 11 (ref 5–15)
BUN: 10 mg/dL (ref 6–20)
CO2: 21 mmol/L — ABNORMAL LOW (ref 22–32)
Calcium: 9 mg/dL (ref 8.9–10.3)
Chloride: 101 mmol/L (ref 98–111)
Creatinine, Ser: 0.72 mg/dL (ref 0.44–1.00)
GFR calc Af Amer: >60 mL/min (ref >60)
GFR calc non Af Amer: >60 mL/min (ref >60)
Glucose, Bld: 109 mg/dL — ABNORMAL HIGH (ref 70–99)
Potassium: 3.8 mmol/L (ref 3.5–5.1)
Sodium: 133 mmol/L — ABNORMAL LOW (ref 135–145)
Total Bilirubin: 0.6 mg/dL (ref 0.3–1.2)
Total Protein: 6.5 g/dL (ref 6.5–8.1)

## 2019-11-26 LAB — TYPE AND SCREEN
ABO/RH(D): A POS
Antibody Screen: NEGATIVE

## 2019-11-26 LAB — ABO/RH: ABO/RH(D): A POS

## 2019-11-26 LAB — PROTEIN / CREATININE RATIO, URINE
Creatinine, Urine: 430.11 mg/dL
Protein Creatinine Ratio: 0.11 mg/mg{Cre} (ref 0.00–0.15)
Total Protein, Urine: 46 mg/dL

## 2019-11-26 MED ORDER — LACTATED RINGERS IV SOLN
500.0000 mL | INTRAVENOUS | Status: DC | PRN
  Administered 2019-11-28: 17:00:00 1000 mL via INTRAVENOUS

## 2019-11-26 MED ORDER — FENTANYL CITRATE (PF) 100 MCG/2ML IJ SOLN: 100.0000 ug | INTRAMUSCULAR | Status: DC | PRN

## 2019-11-26 MED ORDER — ACETAMINOPHEN 325 MG PO TABS
650.0000 mg | ORAL_TABLET | ORAL | Status: DC | PRN
  Administered 2019-11-27: 650 mg via ORAL
  Filled 2019-11-26: qty 2

## 2019-11-26 MED ORDER — SOD CITRATE-CITRIC ACID 500-334 MG/5ML PO SOLN: 30.0000 mL | ORAL | Status: DC | PRN

## 2019-11-26 MED ORDER — BUTORPHANOL TARTRATE 1 MG/ML IJ SOLN
1.0000 mg | Freq: Once | INTRAMUSCULAR | Status: AC
  Administered 2019-11-26: 1 mg via INTRAVENOUS
  Filled 2019-11-26: qty 1

## 2019-11-26 MED ORDER — LACTATED RINGERS IV SOLN: INTRAVENOUS | Status: DC

## 2019-11-26 MED ORDER — LIDOCAINE HCL (PF) 1 % IJ SOLN: 30.0000 mL | INTRAMUSCULAR | Status: DC | PRN

## 2019-11-26 MED ORDER — ONDANSETRON HCL 4 MG/2ML IJ SOLN: 4.0000 mg | Freq: Four times a day (QID) | INTRAMUSCULAR | Status: DC | PRN

## 2019-11-26 MED ORDER — TERBUTALINE SULFATE 1 MG/ML IJ SOLN: 0.2500 mg | Freq: Once | INTRAMUSCULAR | Status: DC | PRN

## 2019-11-26 MED ORDER — OXYTOCIN BOLUS FROM INFUSION
500.0000 mL | Freq: Once | INTRAVENOUS | Status: AC
  Administered 2019-11-28: 500 mL via INTRAVENOUS

## 2019-11-26 MED ORDER — OXYTOCIN 40 UNITS IN NORMAL SALINE INFUSION - SIMPLE MED
2.5000 [IU]/h | INTRAVENOUS | Status: DC
  Filled 2019-11-26: qty 1000

## 2019-11-26 MED ORDER — MISOPROSTOL 50MCG HALF TABLET
50.0000 ug | ORAL_TABLET | ORAL | Status: DC | PRN
  Administered 2019-11-26 – 2019-11-27 (×3): 50 ug via BUCCAL
  Filled 2019-11-26 (×3): qty 1

## 2019-11-26 MED ORDER — BUTORPHANOL TARTRATE 1 MG/ML IJ SOLN
INTRAMUSCULAR | Status: AC
  Administered 2019-11-26: 1 mg via INTRAVENOUS
  Filled 2019-11-26: qty 1

## 2019-11-26 MED ORDER — BUTORPHANOL TARTRATE 1 MG/ML IJ SOLN: 1.0000 mg | Freq: Once | INTRAMUSCULAR | Status: AC

## 2019-11-26 NOTE — Progress Notes (Signed)
   PRENATAL VISIT NOTE  Subjective:  Brittany Barker is a 29 y.o. G2P0010 at [redacted]w[redacted]d being seen today for ongoing prenatal care.  She is currently monitored for the following issues for this high-risk pregnancy and has Encounter for supervision of other normal pregnancy, unspecified trimester; Hx of anxiety disorder; Viral hepatitis complicating pregnancy, third trimester; Von Willebrand disease (HCC); History of substance abuse (HCC); Tobacco use during pregnancy, antepartum; and Hx of bipolar disorder on their problem list.  Patient reports backache and headache. Pt reports headache is behind eyes. Contractions: Irregular. Vag. Bleeding: None.  Movement: Present. Denies leaking of fluid.   The following portions of the patient's history were reviewed and updated as appropriate: allergies, current medications, past family history, past medical history, past social history, past surgical history and problem list.   Objective:   Vitals:   11/26/19 1458  BP: (!) 155/80  Pulse: 94  Weight: 238 lb (108 kg)    Fetal Status: Fetal Heart Rate (bpm): 147   Movement: Present     General:  Alert, oriented and cooperative. Patient is in no acute distress.  Skin: Skin is warm and dry. No rash noted.   Cardiovascular: Normal heart rate noted  Respiratory: Normal respiratory effort, no problems with respiration noted  Abdomen: Soft, gravid, appropriate for gestational age.  Pain/Pressure: Present     Pelvic: Cervical Exam preformed by CNM, 1cm dilated        Extremities: Normal range of motion.  Edema: Trace  Mental Status: Normal mood and affect. Normal behavior. Normal judgment and thought content.   Assessment and Plan:  Pregnancy: G2P0010 at [redacted]w[redacted]d 1. Encounter for supervision of other normal pregnancy, unspecified trimester   2. Gestational hypertension, third trimester - This is the Pt's second High BP (previous on 4/14) and is at 37w. Direct admit to hospital for induction. - Mild Headache at  today's' visit. Pt attributes it to lack of sleep and binge watching TV.    3. Von Willebrand disease (HCC) - Saw Heme on 4/9, see notes  Term labor symptoms and general obstetric precautions including but not limited to vaginal bleeding, contractions, leaking of fluid and fetal movement were reviewed in detail with the patient. Please refer to After Visit Summary for other counseling recommendations.   No follow-ups on file.  No future appointments.  Laural Benes, MS3

## 2019-11-26 NOTE — H&P (Addendum)
OBSTETRIC ADMISSION HISTORY AND PHYSICAL  Brittany Barker is a 29 y.o. female G2P0010 with IUP at 8w2dby LMP presenting for IOL for gHTN and headaches. She reports +FMs, No LOF, no VB, no blurry vision, headaches or peripheral edema, and RUQ pain.  She plans on breast feeding. She will use family planning as birth control. 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 Hepatitis C   . [place]brand [place]     Past Surgical History: Past Surgical History:  Procedure Laterality Date  . MOUTH SURGERY  2019    Obstetrical History: OB History    Gravida  2   Para      Term      Preterm      AB  1   Living        SAB  1   TAB      Ectopic      Multiple      Live Births              Social History Social History   Socioeconomic History  . Marital status: Married    Spouse name: DMontine Circle . Number of children: Not on file  . Years of education: Not on file  . Highest education level: Not on file  Occupational History  . Not on file  Tobacco Use  . Smoking status: Current Every Day Smoker    Packs/day: 1.00    Types: Cigarettes  . Smokeless tobacco: Never Used  Substance and Sexual Activity  . Alcohol use: Not Currently  . Drug use: Yes    Types: Marijuana    Comment: daily  . Sexual activity: Yes    Partners: Male    Birth control/protection: None  Other Topics Concern  . Not on file  Social History Narrative  . Not on file   Social Determinants of Health   Financial Resource Strain:   . Difficulty of Paying Living Expenses:   Food Insecurity:   . Worried About  RCharity fundraiserin the Last Year:   . RArboriculturistin the Last Year:   Transportation Needs:   . LFilm/video editor(Medical):   .Marland KitchenLack of Transportation (Non-Medical):   Physical Activity:   . Days of Exercise per Week:   . Minutes of Exercise per Session:   Stress:   . Feeling of Stress :   Social Connections:   . Frequency of Communication with Friends and Family:   . Frequency of Social Gatherings with Friends and Family:   . Attends Religious Services:   . Active Member of Clubs or Organizations:   . Attends CArchivistMeetings:   .Marland KitchenMarital Status:     Family History: Family History  Problem Relation Age of Onset  . Bipolar disorder Mother   . Hypertension Maternal Grandmother   . Colon cancer Maternal Grandmother     Allergies: Allergies  Allergen Reactions  . Bee Venom Swelling    "My whole body swells"   . Cherry Swelling    "Tongue swells real fast"    Medications Prior to Admission  Medication Sig Dispense Refill Last Dose  . Ascorbic Acid (VITAMIN C) 1000 MG tablet Take 1,000 mg by mouth daily.     . Blood Pressure KIT 1 kit by Does not apply route daily. 1 kit 0   . Blood Pressure Monitoring (BLOOD PRESSURE KIT) DEVI 1 kit by Does not apply route once a week. Check Blood Pressure regularly and record readings into the [employer] App.  Large Cuff.  DX O90.0 1 each 0   . calcium carbonate (TUMS - DOSED IN MG ELEMENTAL CALCIUM) 500 MG chewable tablet Chew 1 tablet by mouth daily.     . Cyanocobalamin (B-12) 1000 MCG SUBL Place 1,000 mcg under the tongue daily. 30 tablet 4   . iron polysaccharides (NIFEREX) 150 MG capsule Take 1 capsule (150 mg total) by mouth daily. (Patient not taking: Reported on 11/14/2019) 60 capsule 3   . OVER THE COUNTER MEDICATION Take 1 Package by mouth daily as needed (sick).     . pantoprazole (PROTONIX) 40 MG tablet Take 1 tablet (40 mg total) by mouth daily. (Patient not taking: Reported on 10/11/2019) 30 tablet 3    . Prenatal Vit-Fe Fumarate-FA (MULTIVITAMIN-PRENATAL) 27-0.8 MG TABS tablet Take 1 tablet by mouth daily at 12 noon.     . sertraline (ZOLOFT) 50 MG tablet Take 1 tablet (50 mg total) by mouth daily. (Patient not taking: Reported on 10/11/2019) 30 tablet 2      Review of Systems   All systems reviewed and negative except as stated in HPI  Blood pressure (!) 129/101, pulse 97, temperature 98.2 F (36.8 C), temperature source Oral, resp. rate 17, height 5' 8"  (1.727 m), weight 106.8 kg, last menstrual period 03/10/2019, SpO2 97 %. General appearance: alert, cooperative, appears stated age and no distress Lungs: clear to auscultation bilaterally Heart: regular rate and rhythm Abdomen: soft, non-tender; bowel sounds normal Pelvic: 1.5cm/50/-3 Extremities: Homans sign is negative, no sign of DVT Presentation: cephalic Fetal monitoringBaseline: 140s bpm, Variability: Good {> 6 bpm), Accelerations: Non-reactive but appropriate for gestational age and Decelerations: Absent Uterine activityNone Dilation: 1.5 Effacement (%): 50 Station: -3 Exam by:: Dr. Marice Potter   Prenatal labs: ABO, Rh:   Antibody:   Rubella:   RPR: Non Reactive (02/12 1036)  HBsAg:    HIV: Non Reactive (02/12 1036)  GBS: Negative/-- (04/14 0329)  1 hr Glucola WNL Genetic screening deferred Anatomy US WNL  Prenatal Transfer Tool  Maternal Diabetes: No Genetic Screening: Declined Maternal Ultrasounds/Referrals: Normal Fetal Ultrasounds or other Referrals:  None Maternal Substance Abuse:  Yes:  Type: Smoker, Marijuana, Other: former opioid abuser Significant Maternal Medications:  None Significant Maternal Lab Results: Group B Strep negative  No results found for this or any previous visit (from the past 24 hour(s)).  Patient Active Problem List   Diagnosis Date Noted  . Labor and delivery, indication for care 11/26/2019  . Hx of bipolar disorder 10/01/2019  . Encounter for supervision of other normal  pregnancy, unspecified trimester 09/14/2019  . Hx of anxiety disorder 09/14/2019  . Viral hepatitis complicating pregnancy, third trimester 09/14/2019  . Von Willebrand disease ([place]) 09/14/2019  . History of substance abuse ([place]) 09/14/2019  . Tobacco use during pregnancy, antepartum 09/14/2019    Assessment/Plan:  Brittany Steely is a 29 y.o. G2P0010 at 32w2dhere for IOL for gHTN and headaches.  #Labor: Admit to L&D floor. Anticipate vaginal delivery. Foley bulb placed manually and filled with 60 mL water; patient tolerated well.  cytotec x 1 given upon  admit. Will recheck patient in 3-4 hours. Anticipate that once FB falls out, will start pitocin.  #Pain: As patient requests. Patient does not want fentanyl.  #FWB:  Cat 1, 80% EFW #ID: GBS negative #MOF: Breast #MOC: Family planning #Circ: Yes, inpatient GHTN: systolic of 161 while in room during exam, however headache relieved by time of admission. No other SF noted. No medications outpatient. CMP, CBC, Protein/creatinine ratio ordered.  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 Per oncology note from 4/9:  -If neuraxial analgesia or anesthesia or C section needed -- will need to correct VWF levels /R-CO actiivity levels to 100% I.e (Humate p -loading dose - VWF:RCo 30 units/kg 1 hour prior to procedure/surgery .  -If C -section - will need maintenance dose of 20 units/kg q12h for 3 days then can consider lysteda 650gm po TID for additional 7-10 days if not breast feeding). -if vaginal delivery would not need factor replacement unless significant Post partum bleeding. Could consider lysteda post-delivery 636m po Tid if not breast feeding.  RAlroy Bailiff DO  11/26/2019, 9:13 PM  I saw and evaluated the patient. I agree with the findings and the plan of care as documented in the resident's note. Vertex by exam. EFW 3000g. Avoid NSAIDs post-partum. Needs to f/u post-partum for Hep C treatment. See Heme/Onc note on 4/9 for further  details. Anticipate SVD.  CBarrington Ellison MD OCalifornia Pacific Medical Center - Van Ness CampusFamily Medicine Fellow, FDelnor Community Hospitalfor WDean Foods Company CDun[place]

## 2019-11-26 NOTE — MAU Note (Signed)
Pt here for direct admit to L&D for IOL for elevated BPs. Was seen in the office today. She does report she had an HA earlier, but went home and took Tylenol and ate and the HA improved. Denies contractions, vaginal bleeding or LOF. Reports good fetal movement. Cervix was 1.5cm today.

## 2019-11-26 NOTE — Progress Notes (Signed)
Patient reports fetal movement and some pain with irregular contractions.

## 2019-11-26 NOTE — Progress Notes (Signed)
   PRENATAL VISIT NOTE  Subjective:  Brittany Barker is a 29 y.o. G2P0010 at [redacted]w[redacted]d being seen today for ongoing prenatal care.  She is currently monitored for the following issues for this high-risk pregnancy and has Encounter for supervision of other normal pregnancy, unspecified trimester; Hx of anxiety disorder; Viral hepatitis complicating pregnancy, third trimester; Von Willebrand disease (HCC); History of substance abuse (HCC); Tobacco use during pregnancy, antepartum; and Hx of bipolar disorder on their problem list.  Patient reports headache.  Contractions: Irregular. Vag. Bleeding: None.  Movement: Present. Denies leaking of fluid.   The following portions of the patient's history were reviewed and updated as appropriate: allergies, current medications, past family history, past medical history, past social history, past surgical history and problem list.   Objective:   Vitals:   11/26/19 1458  BP: (!) 155/80  Pulse: 94  Weight: 238 lb (108 kg)    Fetal Status: Fetal Heart Rate (bpm): 147   Movement: Present     General:  Alert, oriented and cooperative. Patient is in no acute distress.  Skin: Skin is warm and dry. No rash noted.   Cardiovascular: Normal heart rate noted  Respiratory: Normal respiratory effort, no problems with respiration noted  Abdomen: Soft, gravid, appropriate for gestational age.  Pain/Pressure: Present     Pelvic: Cervical exam performed in the presence of a chaperone        Extremities: Normal range of motion.  Edema: Trace  Mental Status: Normal mood and affect. Normal behavior. Normal judgment and thought content.   Assessment and Plan:  Pregnancy: G2P0010 at [redacted]w[redacted]d 1. Encounter for supervision of other normal pregnancy, unspecified trimester   2. Gestational hypertension, third trimester --HTN on home cuff 10/15/19, with normal in office follow up.  Then, HTN with BP 141/90 on 4/14 in office with retake wnl.   --Today, BP 150s/80s, meeting criteria  for GHTN.  --Pt also reports mild h/a. She has not tried any treatment, and she thinks this is from lack of sleep.   --Pt to L&D for IOL today for GHTN at 37 weeks --Cervix 1/30/-3, vertex. Discussed IOL options, questions answered.  3. Von Willebrand disease (HCC) --Appt with hem onc 11/09/19.  See recommendations in chart.   4. History of substance abuse (HCC) --Pt is recovering addict and wants to avoid narcotics for labor and PP pain management.  Term labor symptoms and general obstetric precautions including but not limited to vaginal bleeding, contractions, leaking of fluid and fetal movement were reviewed in detail with the patient. Please refer to After Visit Summary for other counseling recommendations.   No follow-ups on file.  No future appointments.  Sharen Counter, CNM

## 2019-11-27 ENCOUNTER — Encounter (HOSPITAL_COMMUNITY): Payer: Self-pay | Admitting: Obstetrics and Gynecology

## 2019-11-27 LAB — CBC
HCT: 31.8 % — ABNORMAL LOW (ref 36.0–46.0)
Hemoglobin: 10.5 g/dL — ABNORMAL LOW (ref 12.0–15.0)
MCH: 30 pg (ref 26.0–34.0)
MCHC: 33 g/dL (ref 30.0–36.0)
MCV: 90.9 fL (ref 80.0–100.0)
Platelets: 246 10*3/uL (ref 150–400)
RBC: 3.5 MIL/uL — ABNORMAL LOW (ref 3.87–5.11)
RDW: 13.7 % (ref 11.5–15.5)
WBC: 11.1 10*3/uL — ABNORMAL HIGH (ref 4.0–10.5)
nRBC: 0 % (ref 0.0–0.2)

## 2019-11-27 LAB — RPR: RPR Ser Ql: NONREACTIVE

## 2019-11-27 LAB — SARS CORONAVIRUS 2 (TAT 6-24 HRS): SARS Coronavirus 2: NEGATIVE

## 2019-11-27 MED ORDER — BUTORPHANOL TARTRATE 1 MG/ML IJ SOLN
1.0000 mg | Freq: Once | INTRAMUSCULAR | Status: AC
  Administered 2019-11-27: 1 mg via INTRAVENOUS

## 2019-11-27 MED ORDER — EPHEDRINE 5 MG/ML INJ: 10.0000 mg | INTRAVENOUS | Status: DC | PRN

## 2019-11-27 MED ORDER — PHENYLEPHRINE 40 MCG/ML (10ML) SYRINGE FOR IV PUSH (FOR BLOOD PRESSURE SUPPORT)
80.0000 ug | PREFILLED_SYRINGE | INTRAVENOUS | Status: DC | PRN
  Filled 2019-11-27: qty 10

## 2019-11-27 MED ORDER — FENTANYL-BUPIVACAINE-NACL 0.5-0.125-0.9 MG/250ML-% EP SOLN
12.0000 mL/h | EPIDURAL | Status: DC | PRN
  Filled 2019-11-27: qty 250

## 2019-11-27 MED ORDER — LACTATED RINGERS IV SOLN: 500.0000 mL | Freq: Once | INTRAVENOUS | Status: DC

## 2019-11-27 MED ORDER — MISOPROSTOL 25 MCG QUARTER TABLET
25.0000 ug | ORAL_TABLET | ORAL | Status: DC | PRN
  Administered 2019-11-27: 25 ug via VAGINAL

## 2019-11-27 MED ORDER — MISOPROSTOL 25 MCG QUARTER TABLET
ORAL_TABLET | ORAL | Status: AC
  Filled 2019-11-27: qty 1

## 2019-11-27 MED ORDER — BUTORPHANOL TARTRATE 1 MG/ML IJ SOLN
1.0000 mg | Freq: Once | INTRAMUSCULAR | Status: AC
  Administered 2019-11-27: 1 mg via INTRAVENOUS
  Filled 2019-11-27: qty 1

## 2019-11-27 MED ORDER — ANTIHEMOPHILIC FACTOR-VWF 250-600 UNITS IV SOLR
3702.0000 [IU] | Freq: Once | INTRAVENOUS | Status: AC
  Administered 2019-11-28: 3702 [IU] via INTRAVENOUS
  Filled 2019-11-27: qty 0

## 2019-11-27 MED ORDER — BUTORPHANOL TARTRATE 1 MG/ML IJ SOLN
1.0000 mg | Freq: Once | INTRAMUSCULAR | Status: DC
  Filled 2019-11-27: qty 1

## 2019-11-27 MED ORDER — DIPHENHYDRAMINE HCL 50 MG/ML IJ SOLN: 12.5000 mg | INTRAMUSCULAR | Status: DC | PRN

## 2019-11-27 MED ORDER — PHENYLEPHRINE 40 MCG/ML (10ML) SYRINGE FOR IV PUSH (FOR BLOOD PRESSURE SUPPORT): 80.0000 ug | PREFILLED_SYRINGE | INTRAVENOUS | Status: DC | PRN

## 2019-11-27 MED ORDER — TERBUTALINE SULFATE 1 MG/ML IJ SOLN: 0.2500 mg | Freq: Once | INTRAMUSCULAR | Status: DC | PRN

## 2019-11-27 MED ORDER — OXYTOCIN 40 UNITS IN NORMAL SALINE INFUSION - SIMPLE MED
1.0000 m[IU]/min | INTRAVENOUS | Status: DC
  Administered 2019-11-27: 2 m[IU]/min via INTRAVENOUS

## 2019-11-27 NOTE — Progress Notes (Signed)
Labor Progress Note Brittany Barker is a 29 y.o. G2P0010 at [redacted]w[redacted]d presented for IOL for gHTN and headaches.  S: Patient just finished her shower. Pt comfortable.    O:  BP 115/74   Pulse 68   Temp 98.3 F (36.8 C) (Oral)   Resp 20   Ht 5\' 8"  (1.727 m)   Wt 106.6 kg   LMP 03/10/2019 (Approximate)   SpO2 97%   BMI 35.73 kg/m  FHT: off monitor s/p shower TOCO: off monitor s/p shower  CVE: Dilation: 4.5 Effacement (%): 50 Cervical Position: Posterior Station: -3 Presentation: Vertex Exam by:: [email or web address] RN   A&P: 29 y.o. G2P0010 [redacted]w[redacted]d here for IOL for gHTN and headaches.  #Labor: s/p FB and cytotec x 3. Cervical exam grossly unchanged.  Will give a an additional cytotec.  #Pain: pert patient request. Considering nitrous and epidural.  #FWB: NA, previously Cat 1  #GBS negative   #gHTN: BP 115/74 (normotensive).Pt asymptomatic. Pre-E labs unremarkable on admission.   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 Per oncology note from 4/9:  -If neuraxial analgesia or anesthesia or C section needed -- will need to correct VWF levels /R-CO actiivity levels to 100% I.e (Humate p -loading dose -6/9 units/kg 1 hour prior to procedure/surgery . -If C -section - will need maintenance dose of 20 units/kg q12h for 3 days then can consider lysteda 650gm po TID for additional 7-10 days if not breast feeding). -if vaginal delivery would not need factor replacement unless significant Post partum bleeding. Could consider lysteda post-delivery 650mg  po Tid if not breast feeding. Postpartum: Avoid NSAIDs Hep C: needs to f/u PP for treatment  Brittany Simon, DO 9:50 AM

## 2019-11-27 NOTE — Progress Notes (Signed)
Labor Progress Note Brittany Barker is a 29 y.o. G2P0010 at [redacted]w[redacted]d presented for IOL for gHTN and headaches.  S: Feeling less pain and pressure.   O:  BP 128/75   Pulse 69   Temp 98.2 F (36.8 C) (Oral)   Resp 16   Ht 5\' 8"  (1.727 m)   Wt 106.6 kg   LMP 03/10/2019 (Approximate)   SpO2 97%   BMI 35.73 kg/m  FHR: 130s bpm, variability: moderate,  accelerations:  Present,  decelerations:  Absent  CVE: Dilation: 4.5 Effacement (%): 50 Cervical Position: Posterior Station: -3 Presentation: Vertex Exam by:: Dr. [email or web address]   A&P: 29 y.o. G2P0010 [redacted]w[redacted]d IOL for gHTN and headaches #Labor: S/p cytotec x 1 and foley bulb. Will give another cytotec and recheck in four hours. Will likely need one more cytotec then before pitocin. #Pain: Controlled with IV pain meds at this time. Considering nitrous and epidural #FWB: Cat 1, 80% EFW #GBS negative  GHTN: systolic of 100 while in room during exam, however headache relieved by time of admission. No other SF noted. No medications outpatient. CMP, CBC, Protein/creatinine ratio ordered.  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 Per oncology note from 4/9:  -If neuraxial analgesia or anesthesia or C section needed -- will need to correct VWF levels /R-CO actiivity levels to 100% I.e (Humate p -loading dose -6/9 units/kg 1 hour prior to procedure/surgery .  -If C -section - will need maintenance dose of 20 units/kg q12h for 3 days then can consider lysteda 650gm po TID for additional 7-10 days if not breast feeding). -if vaginal delivery would not need factor replacement unless significant Post partum bleeding. Could consider lysteda post-delivery 650mg  po Tid if not breast feeding. Postpartum: Avoid NSAIDs Hep C: needs to f/u PP for treatment  [id number], DO 1:35 AM

## 2019-11-27 NOTE — Progress Notes (Signed)
Labor Progress Note Brittany Barker is a 29 y.o. G2P0010 at [redacted]w[redacted]d presented for IOL for gHTN and headaches.  S: Patient in good spirits listening to music. She awaits the arrival of her husband this afternoon. Headache has resolved.   O:  BP 115/74   Pulse 68   Temp 98.3 F (36.8 C) (Oral)   Resp 20   Ht 5\' 8"  (1.727 m)   Wt 106.6 kg   LMP 03/10/2019 (Approximate)   SpO2 97%   BMI 35.73 kg/m  FHT: 135 / moderate variability / pos accels, no decels  TOCO: irregular   CVE: Dilation: 4.5 Effacement (%): 50 Cervical Position: Posterior Station: -3 Presentation: Vertex Exam by:: [email or web address] RN   A&P: 29 y.o. G2P0010 [redacted]w[redacted]d here for IOL for gHTN and headaches.  #Labor: s/p FB and cytotec x 3. Will recheck cervix in about 1 hr. Consider pitocin if effaced >60-70%.  #Pain: pert patient request. Considering nitrous and epidural.  #FWB: Cat 1 #GBS negative   #gHTN: Normotensive and asymptomatic. Pre-E labs unremarkable on admission.   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 Per oncology note from 4/9:  -If neuraxial analgesia or anesthesia or C section needed -- will need to correct VWF levels /R-CO actiivity levels to 100% I.e (Humate p -loading dose -6/9 units/kg 1 hour prior to procedure/surgery . -If C -section - will need maintenance dose of 20 units/kg q12h for 3 days then can consider lysteda 650gm po TID for additional 7-10 days if not breast feeding). -if vaginal delivery would not need factor replacement unless significant Post partum bleeding. Could consider lysteda post-delivery 650mg  po Tid if not breast feeding. Postpartum: Avoid NSAIDs Hep C: needs to f/u PP for treatment  Symphony Demuro, DO 9:00 AM

## 2019-11-27 NOTE — Progress Notes (Signed)
Labor Progress Note Brittany Barker is a 29 y.o. G2P0010 at [redacted]w[redacted]d presented for IOL for gHTN and headaches.  S: Sleeping well.   O:  BP 124/76   Pulse 74   Temp 98.2 F (36.8 C) (Oral)   Resp 18   Ht 5\' 8"  (1.727 m)   Wt 106.6 kg   LMP 03/10/2019 (Approximate)   SpO2 97%   BMI 35.73 kg/m  FHT: 145 HR, moderate variability, accels present, no decels  CVE: Dilation: 4.5 Effacement (%): 50 Cervical Position: Posterior Station: -3 Presentation: Vertex Exam by:: [email or web address] RN   A&P: 29 y.o. G2P0010 [redacted]w[redacted]d here for IOL for gHTN and headaches.  #Labor: s/p FB and cytotec x 2. 50% effacement so will continue with cytotec. Recheck in 4 hours then consider adding pitocin if effacement has increased. #Pain: Controlled with IV pain meds at this time. Considering nitrous and epidural.  #FWB: Cat 1, 80% EFW #GBS negative   GHTN:systolic of 100 while in room during admit, however all other BP since admission WNL. No other SF noted. No medications outpatient. Portein/creatinine ratio WNL. 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 Per oncology note from 4/9:  -If neuraxial analgesia or anesthesia or C section needed -- will need to correct VWF levels /R-CO actiivity levels to 100% I.e (Humate p -loading dose -6/9 units/kg 1 hour prior to procedure/surgery . -If C -section - will need maintenance dose of 20 units/kg q12h for 3 days then can consider lysteda 650gm po TID for additional 7-10 days if not breast feeding). -if vaginal delivery would not need factor replacement unless significant Post partum bleeding. Could consider lysteda post-delivery 650mg  po Tid if not breast feeding. Postpartum: Avoid NSAIDs Hep C: needs to f/u PP for treatment  [id number], DO 5:58 AM

## 2019-11-27 NOTE — Progress Notes (Signed)
Labor Progress Note Brittany Barker is a 29 y.o. G2P0010 at [redacted]w[redacted]d presented for IOL for gHTN and headaches.  S: Patient comfortable. Is tolerating contractions. Denies pelvic pressure.  No headaches or blurry vision.   O:  BP (!) 148/79   Pulse 77   Temp 99.3 F (37.4 C) (Oral)   Resp 20   Ht 5\' 8"  (1.727 m)   Wt 106.6 kg   LMP 03/10/2019 (Approximate)   SpO2 97%   BMI 35.73 kg/m  FHT: 130 / moderate variability / pos accels, no decels  TOCO: irregular  CVE: Dilation: 5.5 Effacement (%): 60, 70 Cervical Position: Middle Station: -2 Presentation: Vertex Exam by:: [email or web address], RN   A&P: 29 y.o. G2P0010 [redacted]w[redacted]d here for IOL for gHTN and headaches.  #Labor: s/p FB and cytotec x 3. Continue low dose pitocin.  #Pain: per patient request. Considering nitrous and epidural.  #FWB:Cat 1 #GBS negative   #gHTN: Hypertensive 148/79 however asymptomatic. Paitent excited to see her husband.  Will continue to monitor. Pre-E labs unremarkable on admission.   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 Per oncology note from 4/9:  -If neuraxial analgesia or anesthesia or C section needed -- will need to correct VWF levels /R-CO actiivity levels to 100% I.e (Humate p -loading dose -6/9 units/kg 1 hour prior to procedure/surgery . -If C -section - will need maintenance dose of 20 units/kg q12h for 3 days then can consider lysteda 650gm po TID for additional 7-10 days if not breast feeding). -if vaginal delivery would not need factor replacement unless significant Post partum bleeding. Could consider lysteda post-delivery 650mg  po Tid if not breast feeding. Postpartum: Avoid NSAIDs Hep C: needs to f/u PP for treatment  [id number], DO 5:36 PM

## 2019-11-27 NOTE — Progress Notes (Addendum)
Labor Progress Note Brittany Barker is a 29 y.o. G2P0010 at [redacted]w[redacted]d presented for IOL for gHTN and headaches  S: Felt contractions earlier in afternoon that have since stopped. Able to sleep a while this afternoon.  O:  BP 132/70   Pulse (!) 59   Temp 98.2 F (36.8 C) (Oral)   Resp 18   Ht 5\' 8"  (1.727 m)   Wt 106.6 kg   LMP 03/10/2019 (Approximate)   SpO2 97%   BMI 35.73 kg/m  EFM: 125 bpm, moderate variability, no accels, no decels  CVE: Dilation: 5.5 Effacement (%): 60, 70 Cervical Position: Middle Station: -2 Presentation: Vertex Exam by:: [email or web address], RN   A&P: 29 y.o. G2P0010 [redacted]w[redacted]d IOL for gHTN and headaches  #Labor: s/p FB and cytotec x 3. Cont pitocin. Will check CBC at midnight in case pt wants epidural. #Pain: Stadol, undecided on epidural #FWB: Cat 1, 80% EFW #GBS negative  [redacted]w[redacted]d, DO 9:09 PM  Addendum: AROM and IUPC insertion by Dr. Calla Kicks at 2146. Large amount of clear fluid with AROM. Advised patient to let nurse know if she becomes too uncomfortable and we will order CBC for sooner than midnight so Humate P can be ordered an hour before epidural is placed.

## 2019-11-28 ENCOUNTER — Inpatient Hospital Stay (HOSPITAL_COMMUNITY): Payer: Medicaid Other | Admitting: Anesthesiology

## 2019-11-28 ENCOUNTER — Encounter (HOSPITAL_COMMUNITY): Payer: Self-pay | Admitting: Obstetrics and Gynecology

## 2019-11-28 DIAGNOSIS — D680 Von Willebrand's disease: Secondary | ICD-10-CM

## 2019-11-28 DIAGNOSIS — F1221 Cannabis dependence, in remission: Secondary | ICD-10-CM

## 2019-11-28 DIAGNOSIS — F1421 Cocaine dependence, in remission: Secondary | ICD-10-CM

## 2019-11-28 DIAGNOSIS — Z3A37 37 weeks gestation of pregnancy: Secondary | ICD-10-CM

## 2019-11-28 DIAGNOSIS — B1920 Unspecified viral hepatitis C without hepatic coma: Secondary | ICD-10-CM

## 2019-11-28 DIAGNOSIS — Z8659 Personal history of other mental and behavioral disorders: Secondary | ICD-10-CM

## 2019-11-28 DIAGNOSIS — O9912 Other diseases of the blood and blood-forming organs and certain disorders involving the immune mechanism complicating childbirth: Secondary | ICD-10-CM

## 2019-11-28 DIAGNOSIS — F17200 Nicotine dependence, unspecified, uncomplicated: Secondary | ICD-10-CM

## 2019-11-28 DIAGNOSIS — O134 Gestational [pregnancy-induced] hypertension without significant proteinuria, complicating childbirth: Principal | ICD-10-CM

## 2019-11-28 DIAGNOSIS — O9842 Viral hepatitis complicating childbirth: Secondary | ICD-10-CM

## 2019-11-28 DIAGNOSIS — O99334 Smoking (tobacco) complicating childbirth: Secondary | ICD-10-CM

## 2019-11-28 LAB — CBC
HCT: 31.6 % — ABNORMAL LOW (ref 36.0–46.0)
Hemoglobin: 10.5 g/dL — ABNORMAL LOW (ref 12.0–15.0)
MCH: 29.3 pg (ref 26.0–34.0)
MCHC: 33.2 g/dL (ref 30.0–36.0)
MCV: 88.3 fL (ref 80.0–100.0)
Platelets: 201 10*3/uL (ref 150–400)
RBC: 3.58 MIL/uL — ABNORMAL LOW (ref 3.87–5.11)
RDW: 13.5 % (ref 11.5–15.5)
WBC: 10.2 10*3/uL (ref 4.0–10.5)
nRBC: 0 % (ref 0.0–0.2)

## 2019-11-28 MED ORDER — DIBUCAINE (PERIANAL) 1 % EX OINT: 1.0000 | TOPICAL_OINTMENT | CUTANEOUS | Status: DC | PRN

## 2019-11-28 MED ORDER — WITCH HAZEL-GLYCERIN EX PADS: 1.0000 | MEDICATED_PAD | CUTANEOUS | Status: DC | PRN

## 2019-11-28 MED ORDER — COCONUT OIL OIL
1.0000 "application " | TOPICAL_OIL | Status: DC | PRN
  Administered 2019-11-29: 1 via TOPICAL

## 2019-11-28 MED ORDER — PRENATAL MULTIVITAMIN CH
1.0000 | ORAL_TABLET | Freq: Every day | ORAL | Status: DC
  Administered 2019-11-29 – 2019-11-30 (×2): 1 via ORAL
  Filled 2019-11-28 (×2): qty 1

## 2019-11-28 MED ORDER — TETANUS-DIPHTH-ACELL PERTUSSIS 5-2.5-18.5 LF-MCG/0.5 IM SUSP
0.5000 mL | Freq: Once | INTRAMUSCULAR | Status: AC
  Administered 2019-11-29: 0.5 mL via INTRAMUSCULAR
  Filled 2019-11-28: qty 0.5

## 2019-11-28 MED ORDER — DIPHENHYDRAMINE HCL 25 MG PO CAPS: 25.0000 mg | ORAL_CAPSULE | Freq: Four times a day (QID) | ORAL | Status: DC | PRN

## 2019-11-28 MED ORDER — ZOLPIDEM TARTRATE 5 MG PO TABS: 5.0000 mg | ORAL_TABLET | Freq: Every evening | ORAL | Status: DC | PRN

## 2019-11-28 MED ORDER — TRANEXAMIC ACID-NACL 1000-0.7 MG/100ML-% IV SOLN
INTRAVENOUS | Status: AC
  Filled 2019-11-28: qty 100

## 2019-11-28 MED ORDER — BENZOCAINE-MENTHOL 20-0.5 % EX AERO
1.0000 "application " | INHALATION_SPRAY | CUTANEOUS | Status: DC | PRN
  Administered 2019-11-28: 1 via TOPICAL
  Filled 2019-11-28: qty 56

## 2019-11-28 MED ORDER — ACETAMINOPHEN 325 MG PO TABS
650.0000 mg | ORAL_TABLET | ORAL | Status: DC | PRN
  Administered 2019-11-28 – 2019-11-30 (×6): 650 mg via ORAL
  Filled 2019-11-28 (×6): qty 2

## 2019-11-28 MED ORDER — SIMETHICONE 80 MG PO CHEW: 80.0000 mg | CHEWABLE_TABLET | ORAL | Status: DC | PRN

## 2019-11-28 MED ORDER — TRANEXAMIC ACID-NACL 1000-0.7 MG/100ML-% IV SOLN
1000.0000 mg | INTRAVENOUS | Status: AC
  Administered 2019-11-28: 1000 mg via INTRAVENOUS

## 2019-11-28 MED ORDER — LIDOCAINE HCL (PF) 1 % IJ SOLN
INTRAMUSCULAR | Status: DC | PRN
  Administered 2019-11-28: 5 mL via EPIDURAL

## 2019-11-28 MED ORDER — ONDANSETRON HCL 4 MG PO TABS: 4.0000 mg | ORAL_TABLET | ORAL | Status: DC | PRN

## 2019-11-28 MED ORDER — ONDANSETRON HCL 4 MG/2ML IJ SOLN: 4.0000 mg | INTRAMUSCULAR | Status: DC | PRN

## 2019-11-28 MED ORDER — SENNOSIDES-DOCUSATE SODIUM 8.6-50 MG PO TABS
2.0000 | ORAL_TABLET | ORAL | Status: DC
  Administered 2019-11-28: 2 via ORAL
  Filled 2019-11-28 (×2): qty 2

## 2019-11-28 MED ORDER — BUTALBITAL-APAP-CAFFEINE 50-325-40 MG PO TABS
1.0000 | ORAL_TABLET | Freq: Four times a day (QID) | ORAL | Status: DC | PRN
  Administered 2019-11-28: 1 via ORAL
  Filled 2019-11-28: qty 1

## 2019-11-28 MED ORDER — SODIUM CHLORIDE (PF) 0.9 % IJ SOLN
INTRAMUSCULAR | Status: DC | PRN
  Administered 2019-11-28: 12 mL/h via EPIDURAL

## 2019-11-28 MED FILL — Antihemophilic Factor/VWF (Human) For Inj 1000-2400 Unit: INTRAVENOUS | Qty: 3702 | Status: AC

## 2019-11-28 NOTE — Discharge Summary (Signed)
Postpartum Discharge Summary    Patient Name: Brittany Barker DOB: [birth year] MRN: [id number]  Date of admission: 11/26/2019 Delivering Provider: Lyndee Hensen   Date of discharge: 11/30/2019  Admitting diagnosis: Labor and delivery, indication for care [O75.9] Intrauterine pregnancy: [redacted]w[redacted]d    Secondary diagnosis:  Active Problems:   Encounter for supervision of other normal pregnancy, unspecified trimester   Hx of anxiety disorder   Viral hepatitis complicating pregnancy, third trimester   [place]   History of substance abuse ([place]   Tobacco use during pregnancy, antepartum   Hx of bipolar disorder   Labor and delivery, indication for care   NSVD (normal spontaneous vaginal delivery)  Additional problems:      Discharge diagnosis: Term Pregnancy Delivered and Gestational Hypertension                                                                                                Post partum procedures:None  Augmentation: AROM, Pitocin, Cytotec and Foley Balloon  Complications: None  Hospital course:  Induction of Labor With Vaginal Delivery   29y.o. yo G2P1011 at 365w4das admitted to the hospital 11/26/2019 for induction of labor.  Indication for induction: Gestational hypertension.  Patient had an uncomplicated labor course as follows: induced with foley bulb, misoprostol x3, pitocin, and AROM. She progressed to complete and had NSVD after four pushes. 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  Membrane Rupture Time/Date: 9:46 PM ,11/27/2019   Intrapartum Procedures: Episiotomy: None [1]                                         Lacerations:  1st degree [2];Perineal [11];Periurethral [8]  Patient had delivery of a Viable infant.  Information for the patient's newborn:  MaKmari, [id number]]Delivery Method: Vaginal, Spontaneous(Filed from Delivery Summary)    11/28/2019  Details of delivery can be found in  separate delivery note.   Patient had a routine postpartum course.   BP was normal postpartum ranging in 120's/70-80's, and BP check was scheduled for 12/06/2019.   Noted post-natally that patient had positive Hepatitis C antibodies early in pregnancy. Patient needs quantitative hepatitis C viral load ordered at post-partum follow up and referral to GI if viral load is detected.  No bleeding problems noted post-partum, patient should follow up with hematology to further evaluate possible diagnosis of Von Willebrand's disease.   Patient is discharged home 11/30/19. Delivery time: 4:24 PM    Magnesium Sulfate received: No BMZ received: No Rhophylac:N/A MMR:N/A Transfusion:No  Physical exam  Vitals:   11/29/19 0000 11/29/19 0415 11/29/19 2230 11/30/19 0550  BP: 122/79 120/77 122/76 129/81  Pulse: (!) 59 68 62 65  Resp: 19 18  20   Temp: 98.7 F (37.1 C) 98.4 F (36.9 C) 98.4 F (36.9 C) 98.5 F (36.9 C)  TempSrc: Oral Oral Oral Oral  SpO2: 100% 100% 98% 100%  Weight:      Height:  General: alert, cooperative and no distress Lochia: appropriate Uterine Fundus: firm Incision: N/A DVT Evaluation: No evidence of DVT seen on physical exam. No significant calf/ankle edema. Labs: Lab Results  Component Value Date   WBC 11.1 (H) 11/29/2019   HGB 9.5 (L) 11/29/2019   HCT 28.9 (L) 11/29/2019   MCV 90.6 11/29/2019   PLT 206 11/29/2019   CMP Latest Ref Rng & Units 11/26/2019  Glucose 70 - 99 mg/dL 109(H)  BUN 6 - 20 mg/dL 10  Creatinine 0.44 - 1.00 mg/dL 0.72  Sodium 135 - 145 mmol/L 133(L)  Potassium 3.5 - 5.1 mmol/L 3.8  Chloride 98 - 111 mmol/L 101  CO2 22 - 32 mmol/L 21(L)  Calcium 8.9 - 10.3 mg/dL 9.0  Total Protein 6.5 - 8.1 g/dL 6.5  Total Bilirubin 0.3 - 1.2 mg/dL 0.6  Alkaline Phos 38 - 126 U/L 73  AST 15 - 41 U/L 17  ALT 0 - 44 U/L 19   Edinburgh Score: Edinburgh Postnatal Depression Scale Screening Tool 11/29/2019  I have been able to laugh and see the  funny side of things. (No Data)    Discharge instruction: per After Visit Summary and "Baby and Me Booklet".  After visit meds:  Allergies as of 11/30/2019      Reactions   Bee Venom Swelling   "My whole body swells"   Cherry Swelling   "Tongue swells real fast"      Medication List    TAKE these medications   acetaminophen 325 MG tablet Commonly known as: Tylenol Take 2 tablets (650 mg total) by mouth every 4 (four) hours as needed (for pain scale < 4).   B-12 1000 MCG Subl Place 1,000 mcg under the tongue daily.   Blood Pressure Kit 1 kit by Does not apply route daily.   Blood Pressure Kit Devi 1 kit by Does not apply route once a week. Check Blood Pressure regularly and record readings into the Babyscripts App.  Large Cuff.  DX O90.0   calcium carbonate 500 MG chewable tablet Commonly known as: TUMS - dosed in mg elemental calcium Chew 1 tablet by mouth 3 (three) times daily as needed for indigestion or heartburn.   iron polysaccharides 150 MG capsule Commonly known as: NIFEREX Take 1 capsule (150 mg total) by mouth daily.   multivitamin-prenatal 27-0.8 MG Tabs tablet Take 1 tablet by mouth daily.       Diet: routine diet  Activity: Advance as tolerated. Pelvic rest for 6 weeks.   Outpatient follow up:6 weeks Follow up Appt: Future Appointments  Date Time Provider Anton Ruiz  12/06/2019 11:00 AM [place] None  12/27/2019 10:15 AM Lajean Manes, CNM [facility] None   Follow up Visit:   Please schedule this patient for Postpartum visit in: 6 weeks with the following provider: MD In-Person For C/S patients schedule nurse incision check in weeks 2 weeks: no High risk pregnancy complicated by: Fabio Bering disease Delivery mode:  SVD Anticipated Birth Control:  other/unsure PP Procedures needed: BP check, check HCV viral load, referral back to Heme/Onc Schedule Integrated BH visit: no   Newborn Data: Live born female  Birth  Weight: 6 lb 12.1 oz (3065 g) APGAR: 8, 9  Newborn Delivery   Birth date/time: 11/28/2019 16:24:00 Delivery type: Vaginal, Spontaneous      Baby Feeding: Breast Disposition:home with mother   11/30/2019 Clarnce Flock, MD

## 2019-11-28 NOTE — Anesthesia Procedure Notes (Signed)
Epidural Patient location during procedure: OB Start time: 11/28/2019 4:14 AM End time: 11/28/2019 4:30 AM  Staffing Anesthesiologist: Trevor Iha, MD Performed: anesthesiologist   Preanesthetic Checklist Completed: patient identified, IV checked, site marked, risks and benefits discussed, surgical consent, monitors and equipment checked, pre-op evaluation and timeout performed  Epidural Patient position: sitting Prep: DuraPrep and site prepped and draped Patient monitoring: continuous pulse ox and blood pressure Approach: midline Location: L3-L4 Injection technique: LOR air  Needle:  Needle type: Tuohy  Needle gauge: 17 G Needle length: 9 cm and 9 Needle insertion depth: 9 cm Catheter type: closed end flexible Catheter size: 19 Gauge Catheter at skin depth: 14 cm Test dose: negative  Assessment Events: blood not aspirated, injection not painful, no injection resistance, no paresthesia and negative IV test  Additional Notes Patient identified. Risks/Benefits/Options discussed with patient including but not limited to bleeding, infection, nerve damage, paralysis, failed block, incomplete pain control, headache, blood pressure changes, nausea, vomiting, reactions to medication both or allergic, itching and postpartum back pain. Confirmed with bedside nurse the patient's most recent platelet count. Confirmed with patient that they are not currently taking any anticoagulation, have any bleeding history or any family history of bleeding disorders. Patient expressed understanding and wished to proceed. All questions were answered. Sterile technique was used throughout the entire procedure. Please see nursing notes for vital signs. Test dose was given through epidural needle and negative prior to continuing to dose epidural or start infusion. Warning signs of high block given to the patient including shortness of breath, tingling/numbness in hands, complete motor block, or any  concerning symptoms with instructions to call for help. Patient was given instructions on fall risk and not to get out of bed. All questions and concerns addressed with instructions to call with any issues.  1 Attempt (S) . Patient tolerated procedure well.

## 2019-11-28 NOTE — Progress Notes (Signed)
Labor Progress Note Brittany Barker is a 29 y.o. G2P0010 at [redacted]w[redacted]d presented for IOL for gHTN and headaches  S: Patient reports headache and nausea. She is otherwise comfortable.   O:  BP (!) 110/43   Pulse 61   Temp 98.1 F (36.7 C) (Oral)   Resp 18   Ht 5\' 8"  (1.727 m)   Wt 106.6 kg   LMP 03/10/2019 (Approximate)   SpO2 100%   BMI 35.73 kg/m   EFM: 125 / moderate variability / pos accels, no decels TOCO: irregular   CVE: Dilation: 5.5 Effacement (%): 90 Cervical Position: Middle Station: -2 Presentation: Vertex Exam by:: Foley,rn   A&P: 29 y.o. G2P0010 [redacted]w[redacted]d presented for IOL for gHTN and headaches #Labor: s/p FB, cytotec x 3, pitocin and AROM. Humate given prior to epidural.  Pitocin restarted. Anticipate vaginal delivery.  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 Per oncology note from 4/9:  -If neuraxial analgesia or anesthesia or C section needed -- will need to correct VWF levels /R-CO actiivity levels to 100% I.e (Humate p -loading dose -6/9 units/kg 1 hour prior to procedure/surgery . -If C -section - will need maintenance dose of 20 units/kg q12h for 3 days then can consider lysteda 650gm po TID for additional 7-10 days if not breast feeding). -if vaginal delivery would not need factor replacement unless significant Post partum bleeding. Could consider lysteda post-delivery 650mg  po Tid if not breast feeding. Postpartum: Avoid NSAIDs Hep C:needs to f/u PP for treatment  Emberlee Sortino, DO 9:09 AM

## 2019-11-28 NOTE — Progress Notes (Signed)
Labor Progress Note [place] is a 29 y.o. G2P0010 at [redacted]w[redacted]d presented for IOL for gHTN and headaches  S: Increased amount of pain. Asking for epidural.  O:  BP 117/80   Pulse 64   Temp 98.9 F (37.2 C) (Oral)   Resp 18   Ht 5\' 8"  (1.727 m)   Wt 106.6 kg   LMP 03/10/2019 (Approximate)   SpO2 97%   BMI 35.73 kg/m  FHT: 125 bpm, tracing difficult to interpret but appears to have moderate to marked variability without decels. Unable to intepret if accels present.  CVE: Dilation: 5.5 Effacement (%): 70 Cervical Position: Middle Station: -2, -1 Presentation: Vertex Exam by:: [email or web address] [place] RN   A&P: 29 y.o. G2P0010 [redacted]w[redacted]d for IOL for gHTN and headaches #Labor: s/p FB and cytotec x 3. Cont pitocin. S/p AROM. CBC at midnight shows 201k platelets. Anticipate SVD.  #Pain: Asking for epidural, but must give Humate-P 1 hour before procedures. Dr. [redacted]w[redacted]d called down to pharmacy at 0215 for Humate. 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 Per oncology note from 4/9:  -If neuraxial analgesia or anesthesia or C section needed -- will need to correct VWF levels /R-CO actiivity levels to 100% I.e (Humate p -loading dose -6/9 units/kg 1 hour prior to procedure/surgery . -If C -section - will need maintenance dose of 20 units/kg q12h for 3 days then can consider lysteda 650gm po TID for additional 7-10 days if not breast feeding). -if vaginal delivery would not need factor replacement unless significant Post partum bleeding. Could consider lysteda post-delivery 650mg  po Tid if not breast feeding. Postpartum: Avoid NSAIDs Hep C:needs to f/u PP for treatment  WCH:[id number], DO 2:19 AM

## 2019-11-28 NOTE — Progress Notes (Signed)
Labor Progress Note Brittany Barker is a 29 y.o. G2P0010 at [redacted]w[redacted]d presented for IOL for gHTN and headaches  S: Patient comfortable.  Reports headache has resolved.  Denies pelvic pain or pressure.   O:  BP (!) 121/53   Pulse 63   Temp 97.8 F (36.6 C) (Oral)   Resp 20   Ht 5\' 8"  (1.727 m)   Wt 106.6 kg   LMP 03/10/2019 (Approximate)   SpO2 100%   BMI 35.73 kg/m   EFM: 130 / mod variability / pos accels, occ variable decels  TOCO: every 2-5 minutes   CVE: Dilation: 5.5 Effacement (%): 90 Cervical Position: Middle Station: -2 Presentation: Vertex Exam by:: Foley,rn   A&P: 29 y.o. G2P0010 [redacted]w[redacted]d presented for IOL for gHTN and headaches #Labor: s/p FB, cytotec x 3, pitocin and AROM. Humate given prior to epidural.  Continue pitocin.  Anticipation vaginal delivery.  #FWB: Cat 2, reassuring with moderate variability  #GBS negative  #gHTN: Normotensive. No headache s/p Fioricet.  Pre-E labs on admission unremarkable. Consider repeating Pre-E labs.     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 Per oncology note from 4/9:  -If neuraxial analgesia or anesthesia or C section needed -- will need to correct VWF levels /R-CO actiivity levels to 100% I.e (Humate p -loading dose -6/9 units/kg 1 hour prior to procedure/surgery . -If C -section - will need maintenance dose of 20 units/kg q12h for 3 days then can consider lysteda 650gm po TID for additional 7-10 days if not breast feeding). -if vaginal delivery would not need factor replacement unless significant Post partum bleeding. Could consider lysteda post-delivery 650mg  po Tid if not breast feeding. Postpartum: Avoid NSAIDs Hep C:needs to f/u PP for treatment  Azarius Lambson, DO 12:09 PM

## 2019-11-28 NOTE — Progress Notes (Signed)
Labor Progress Note [place] is a 29 y.o. G2P0010 at [redacted]w[redacted]d presented for IOL for gHTN and headaches  S: Comfortable. Denies headache.  Would like to take a nap before delivery.   O:  BP 116/60   Pulse 60   Temp 98.2 F (36.8 C) (Oral)   Resp 20   Ht 5\' 8"  (1.727 m)   Wt 106.6 kg   LMP 03/10/2019 (Approximate)   SpO2 100%   BMI 35.73 kg/m   EFM: 125 / moderate variability / pos accels, occ early decels, occ variable decels  TOCO: every 2-4 minutes    CVE: Dilation: 8.5 Effacement (%): 100 Cervical Position: Middle Station: 0 Presentation: Vertex Exam by:: Foley,rn   A&P: 29 y.o. G2P0010 [redacted]w[redacted]d presented for IOL for gHTN and headaches #Labor: s/p FB, cytotec x 3, pitocin and AROM. Progressing well.  Continue pitocin.  Anticipate vaginal delivery. Reassess in 1-2 hours.  #FWB: Cat 1-2, reassuring for moderate variability  #GBS negative  #gHTN: Normotensive and symptomatic. Continue to monitor.     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 Per oncology note from 4/9:  -If neuraxial analgesia or anesthesia or C section needed -- will need to correct VWF levels /R-CO actiivity levels to 100% I.e (Humate p -loading dose -6/9 units/kg 1 hour prior to procedure/surgery . -If C -section - will need maintenance dose of 20 units/kg q12h for 3 days then can consider lysteda 650gm po TID for additional 7-10 days if not breast feeding). -if vaginal delivery would not need factor replacement unless significant Post partum bleeding. Could consider lysteda post-delivery 650mg  po Tid if not breast feeding. Postpartum: Avoid NSAIDs Hep C:needs to f/u PP for treatment  IBB:[id number], DO 3:23 PM

## 2019-11-28 NOTE — Progress Notes (Signed)
I was called by staff regarding exceptions to visitation policy when it was discovered that 2 visitors were in the room when the anesthesia provider went into the room for the epidural. I assured that NO exceptions had been made in this case or any other that does not follow our current visitation guidelines. We were aware that the mother was being allowed to be the support person until the FOB arrived (he is in the [employer] and had to travel from [place] after [employer] process).  We later discovered that the FOB had arrived to the facility prior to normal visitation hour being over, so the mother was allowed to remain in the room until visitation ended at 8pm.  Unfortunately, the mother chose to ignore the overhead announcement and remained in the room. The visitation guidelines were explained to the mother and she was asked to leave by staff.  When this did not occur in a timely manner, I asked security to visit the room to enforce the visitation policy and escort the mother out.  I was asked by the Specialty Care Coordinator to come speak to the patient regarding the events explained above.  Upon entering the room, the patient began yelling for the Care Coordinator to leave the room.  The patient never allowed me to discuss the situation with her.  She just continued to yell and say that her "rights are being violated." Had the opportunity been given, I would have shared that the Care Coordinator is part of the leadership team and can not be banned from the room for safety reasons.  This person is part of the delivery team and would also respond in the event of an emergency.  We would never put a patient at risk to accommodate an unreasonable request.  Since that opportunity was not given, I am hoping that the patient will read this note and understand that the hospital has to make good patient care decisions even if the patient doesn't feel like we are.  She eventually asked to be transferred to another  facility.  We then stepped out of the room to involve the provider in that request.  While the resident was in the room, the FOB came out and said "can we just get her into a wheelchair?"  I explained that since she now had an epidural, several things medically needed to happen before it would be safe for them to leave.  I assured him that the provider in the room would gladly explain all the risks involved regarding their request to leave.  Eventually, the Fellow and the AD from the department went into the room to speak with the patient.  As noted in the Resident's note, the patient was essentially mad because her mother had been asked to leave.  I was assured that the patient was very apologetic and "was embarrassed at the whole situation."  However, the patients behavior afterwards proved otherwise with similar behavior that I encountered and explained above.  Unfortunately, had the patient and family been compliant with the visitation policy, none of this would have occurred.

## 2019-11-28 NOTE — Progress Notes (Signed)
Labor Progress Note Brittany Barker is a 29 y.o. G2P0010 at [redacted]w[redacted]d presented for IOL for gHTN and headaches  S: Called to room from nurse for patient wanting to leave AMA due to one-visitor policy. Patient stated she felt disrespected because her mother was "kicked out" during her epidural. Patient redirected and calmed down with deep breathing and now is okay to stay.  O:  BP 137/71   Pulse 61   Temp 98.9 F (37.2 C) (Oral)   Resp 17   Ht 5\' 8"  (1.727 m)   Wt 106.6 kg   LMP 03/10/2019 (Approximate)   SpO2 100%   BMI 35.73 kg/m  EFM: 140 bpm, moderate variability, accels present, no decels  CVE: Dilation: 5.5 Effacement (%): 70 Cervical Position: Middle Station: -2, -1 Presentation: Vertex Exam by:: [email or web address] [place] RN   A&P: 29 y.o. G2P0010 [redacted]w[redacted]d presented for IOL for gHTN and headaches #Labor: s/p FB, cytotec x 3, pitocin and AROM. S/p Humate P 1 hour before epidural. Patient calmer now, agreeing [facility]. Anticipate SVD.  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 Per oncology note from 4/9:  -If neuraxial analgesia or anesthesia or C section needed -- will need to correct VWF levels /R-CO actiivity levels to 100% I.e (Humate p -loading dose -6/9 units/kg 1 hour prior to procedure/surgery . -If C -section - will need maintenance dose of 20 units/kg q12h for 3 days then can consider lysteda 650gm po TID for additional 7-10 days if not breast feeding). -if vaginal delivery would not need factor replacement unless significant Post partum bleeding. Could consider lysteda post-delivery 650mg  po Tid if not breast feeding. Postpartum: Avoid NSAIDs Hep C:needs to f/u PP for treatment  [id number], DO 5:42 AM

## 2019-11-28 NOTE — Anesthesia Preprocedure Evaluation (Signed)
Anesthesia Evaluation  Patient identified by MRN, date of birth, ID band Patient awake    Reviewed: Allergy & Precautions, NPO status , Patient's Chart, lab work & pertinent test results  Airway Mallampati: II  TM Distance: >3 FB Neck ROM: Full    Dental no notable dental hx. (+) Teeth Intact   Pulmonary Current Smoker,    Pulmonary exam normal breath sounds clear to auscultation       Cardiovascular hypertension, Normal cardiovascular exam Rhythm:Regular Rate:Normal  gestational   Neuro/Psych    GI/Hepatic (+)     substance abuse  , Hepatitis -, C  Endo/Other  negative endocrine ROS  Renal/GU      Musculoskeletal negative musculoskeletal ROS (+)   Abdominal (+) + obese,   Peds  Hematology  (+) Blood dyscrasia, , Hx of VWB to get Humate P1 hour before epidural per 4/9 Hematology not  Lab Results      Component                Value               Date                      WBC                      10.2                11/27/2019                HGB                      10.5 (L)            11/27/2019                HCT                      31.6 (L)            11/27/2019                MCV                      88.3                11/27/2019                PLT                      201                 11/27/2019              Anesthesia Other Findings   Reproductive/Obstetrics                            Anesthesia Physical Anesthesia Plan  ASA: III  Anesthesia Plan: Epidural   Post-op Pain Management:    Induction:   PONV Risk Score and Plan:   Airway Management Planned:   Additional Equipment:   Intra-op Plan:   Post-operative Plan:   Informed Consent: I have reviewed the patients History and Physical, chart, labs and discussed the procedure including the risks, benefits and alternatives for the proposed anesthesia with the patient or authorized representative who has indicated  his/her understanding and acceptance.  Plan Discussed with:   Anesthesia Plan Comments: (37.4 wk G2P0 w Hx of HHep C , Substance abuse , PIH and VWB dz (getting Humate P  1 hour prior to LEA placment) for LEA)       Anesthesia Quick Evaluation

## 2019-11-29 LAB — CBC
HCT: 28.9 % — ABNORMAL LOW (ref 36.0–46.0)
Hemoglobin: 9.5 g/dL — ABNORMAL LOW (ref 12.0–15.0)
MCH: 29.8 pg (ref 26.0–34.0)
MCHC: 32.9 g/dL (ref 30.0–36.0)
MCV: 90.6 fL (ref 80.0–100.0)
Platelets: 206 10*3/uL (ref 150–400)
RBC: 3.19 MIL/uL — ABNORMAL LOW (ref 3.87–5.11)
RDW: 13.5 % (ref 11.5–15.5)
WBC: 11.1 10*3/uL — ABNORMAL HIGH (ref 4.0–10.5)
nRBC: 0 % (ref 0.0–0.2)

## 2019-11-29 MED ORDER — VITAMIN B-12 1000 MCG PO TABS
1000.0000 ug | ORAL_TABLET | Freq: Every day | ORAL | Status: DC
  Administered 2019-11-29: 1000 ug via ORAL
  Filled 2019-11-29 (×2): qty 1

## 2019-11-29 MED ORDER — FERROUS SULFATE 325 (65 FE) MG PO TABS
325.0000 mg | ORAL_TABLET | Freq: Every day | ORAL | Status: DC
  Administered 2019-11-29 – 2019-11-30 (×2): 325 mg via ORAL
  Filled 2019-11-29 (×2): qty 1

## 2019-11-29 NOTE — Anesthesia Postprocedure Evaluation (Signed)
Anesthesia Post Note  Patient: Brittany Barker  Procedure(s) Performed: AN AD HOC LABOR EPIDURAL     Patient location during evaluation: Mother Baby Anesthesia Type: Epidural Level of consciousness: awake and alert, oriented and patient cooperative Pain management: pain level controlled Vital Signs Assessment: post-procedure vital signs reviewed and stable Respiratory status: spontaneous breathing, nonlabored ventilation and respiratory function stable Cardiovascular status: stable Postop Assessment: no headache, no backache and epidural receding Anesthetic complications: no Comments: Patient verbalizes negative interaction with anesthesiologist who placed epidural and patient verbalizes she signed the consent and then when the anesthesiologist left the room she tore it up and threw it in the trash.  Patient verbalizes she is willing to re-sign the consent.  Communicated this information to today's anesthesiologist Dr. Hyacinth Meeker who verbalized to write note about it.  Apology offered to patient since our goal is to always satisfy patients, however noting the circumstances of the interaction are unknown at this writing.      Last Vitals:  Vitals:   11/29/19 0000 11/29/19 0415  BP: 122/79 120/77  Pulse: (!) 59 68  Resp: 19 18  Temp: 37.1 C 36.9 C  SpO2: 100% 100%    Last Pain:  Vitals:   11/29/19 0732  TempSrc:   PainSc: 3    Pain Goal: Patients Stated Pain Goal: 8 (11/27/19 0746)              Epidural/Spinal Function Cutaneous sensation: Normal sensation (11/29/19 0732), Patient able to flex knees: Yes (11/29/19 0732), Patient able to lift hips off bed: Yes (11/29/19 0732), Back pain beyond tenderness at insertion site: No (11/29/19 0732), Progressively worsening motor and/or sensory loss: No (11/29/19 0732), Bowel and/or bladder incontinence post epidural: No (11/29/19 0732)  [name]

## 2019-11-29 NOTE — Progress Notes (Addendum)
Post Partum Day 1 Subjective: Patient doing well.  Reports voiding and having bowel movements.  Pt ambulatory in the room and is eating well. Reports some cramping. Denies headache, dizziness and blurry vision.  Objective: Blood pressure 120/77, pulse 68, temperature 98.4 F (36.9 C), temperature source Oral, resp. rate 18, height 5\' 8"  (1.727 m), weight 106.6 kg, last menstrual period 03/10/2019, SpO2 100 %, unknown if currently breastfeeding.  Physical Exam:  General: alert and no distress Lochia: appropriate Uterine Fundus: firm Incision: NA DVT Evaluation: No evidence of DVT seen on physical exam.  Recent Labs    11/27/19 2351 11/29/19 0510  HGB 10.5* 9.5*  HCT 31.6* 28.9*    Assessment/Plan: Plan for discharge tomorrow, Breastfeeding, Circumcision prior to discharge and Contraception : none. Normotensive. Platelets and hemoglobin stable.    LOS: 3 days   12/01/19, DO  11/29/2019, 7:39 AM   I saw and evaluated the patient. I agree with the findings and the plan of care as documented in the resident's note. Heating pad ordered along with PO iron/B12. Doing well, plan for DC tomorrow. Bleeding stable. Needs to f/u with ID or Hepatology for Hep C treatment; recommend referral on discharge.  12/01/2019, MD [facility] Family Medicine Fellow, [facility] for [facility]

## 2019-11-29 NOTE — Clinical Social Work Maternal (Addendum)
CLINICAL SOCIAL WORK MATERNAL/CHILD NOTE  Patient Details  Name: Brittany Barker MRN: [id number] Date of Birth: February 13, 1991  Date:  11/29/2019  Clinical Social Worker Initiating Note:  Hortencia Pilar, LCSW Date/Time: Initiated:  11/29/19/1230     Child's Name:  Brittany Barker   Biological Parents:  Mother, Father(Brittany Barker)   Need for Interpreter:  None   Reason for Referral:  Behavioral Health Concerns, Current Substance Use/Substance Use During Pregnancy    Address:  [address] [place] [address]    Phone number:  [phone] (home)     Additional phone number: none given   Household Members/Support Persons (HM/SP):   Household Member/Support Person 1   HM/SP Name Relationship DOB or Age  HM/SP -1 Brittany Barker  MOB   29 years old   HM/SP -2    FOB's mother     HM/SP -3        HM/SP -4        HM/SP -5        HM/SP -6        HM/SP -7        HM/SP -8          Natural Supports (not living in the home):  Spouse/significant other, Parent   Professional Supports: None   Employment: Unemployed   Type of Work: none reported at this time.   Education:  9 to 11 years   Homebound arranged: No  Financial Resources:    none reported.   Other Resources:  WIC(MOB reports a plan to apply for [facility]   Cultural/Religious Considerations Which May Impact Care:  none reported to CSW.   Strengths:  Ability to meet basic needs , Compliance with medical plan , Home prepared for child    Psychotropic Medications:      MOB reports that in the past she was on Buspar and Zoloft, however currently not taking any medications.    Pediatrician:     unknown   Pediatrician List:   [facility]      Pediatrician Fax Number:    Risk Factors/Current Problems:  None   Cognitive State:  Alert , Able to Concentrate    Mood/Affect:  Calm , Relaxed , Interested ,  Tearful    CSW Assessment: CSW consulted as MOB reportedly has hx of anxiety and Bipolar. It is also noted that MOB reported THC use during this pregnancy. CSW went to speak with MOB at bedside to address further needs.   CSW congratulated MOB and FOB on the birth of infant. MOB was welcoming towards CSW. CSW advised MOB of th HIPPA policy in which MOB reported that it was fine for FOB to remain in the room as MOB reported "he's my husband". CSW understanding and advised MOB of CSW's role and the reason for CSW coming to visit. MOB reported that she has never had Bipolar. MOB does report that she dealt with anxiety in the past but reports nothing currently. MOB reported that in the past she was on Buspar and Zoloft for anxiety but denies taking any medications during this pregnancy. MOB informed CSW that her mental health is fine and she has no concerns at this time. MOB also reported no desire for medications management or therapy at this time. MOB denies SI and HI and reports that she has family and "  some friends" who are her supports. CSW inquired from [facility] on any other mental health diagnosis and MOB denies any further diagnosis at this time.  CSW was advised by MOB that she evaluates herself reguarly in the event that mental health needs arise, CSW praised MOB for this. MOB reported that she has plans to move to [place] with spouse as spouse is in the [employer] and spouse is a good support for her.   CSW inquired from [facility] on her THC use during pregnancy. MOB was very open and forthcoming with informing CSW that she did use THC during this pregnancy. CSW was advised that MOB was suffering from a great deal of morning sickness, not being able to take PNV and heart burn. MOB reports that [facility] was the only thing that would help her at that time. CSW very understanding and began to inform MOB of the hospital drug screen policy. MOB was advised that infants UDS returned and is positive for THC. CSW began to  tell MOB that CSW would need to make a CPS report and MOB became a little frustrated as MOB reports "this is why I didn't want to talk to you all. 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 I told them (not sure if MOB was referring to [facility] or other agency) that I smoked everyday". CSW offered support to [facility] and reported to her that this is not CSW's rule but what the hospital requires. MOB expressed "I reported this to them throughout my pregnancy so I'd like to know why they weren't called sooner. Now that my son is here, I really don't want them in my business, and I don't want to talk with you anymore". CSW understanding of this and offered to clarify CPS's role, however MOB began to cry and report that she doesn't wish to speak with CSW any longer. CSW understanding and left MOB at bedside.   CSW will reach out to [facility] CPS to make report at this time for infants positive UDS for [facility]. CSW will also continue monitor infants CDS to update report if needed.   CSW Plan/Description:  Child Protective Service Report , [place], CSW Will Continue to Monitor Umbilical Cord Tissue Drug Screen Results and Make Report if Mardene Sayer, [place] 11/29/2019, 1:02 PM

## 2019-11-29 NOTE — Lactation Note (Signed)
This note was copied from a baby's chart. Lactation Consultation Note  Patient Name: Brittany Barker Today's Date: 11/29/2019 Reason for consult: Initial assessment;Early term 37-38.6wks;Primapara;1st time breastfeeding  P1 mother whose infant is now 92 hours old.  This is an ETI at 37+4 weeks.  Baby has recently had a circumcision and was not interested in waking up to breast feed.  Baby was asleep in the bassinet when I arrived.  Due to his gestational age and increased risk for jaundice I suggested mother initiate pumping with the DEBP.  Discussed his gestational age and how most babies are sleepy after circumcision, especially the ETI.  Mother feels like he was feeding well prior to the circumcision, however, I did not see him latch.  Reviewed the benefits of beginning to pump early and mother agreeable.    Mother had a Medela pump that was gifted to her but was missing pieces.  I suggested she go online and obtain the missing pieces.  Reviewed pump parts, assembly, disassembly and cleaning of pump parts.  Mother did a return demonstration of pump parts assembly.  Observed her pumping and, at this time, the #24 flange size is appropriate.  Discussed changing to the #27 size as needed.   While mother pumped I helped her establish a feeding plan.  She will continue to observe for feeding cues and feed at least every three hours.  Encouraged to continue hand expression and pumping after every breast feeding attempt.  Mother will feed all colostrum drops to baby as she obtains them.  Colostrum container provided and milk storage times reviewed.  Finger feeding demonstrated.  Suggested mother call her RN/LC for latch assistance as needed.  Suggested father open the blinds in their room and allow baby to sleep in the light due to the increased risk of  jaundice  Father obliged my request.    I would also suggest baby begin supplementation at 24 hours of age if he does not begin to awaken and feed.  Rn  updated.   Maternal Data Formula Feeding for Exclusion: No Has patient been taught Hand Expression?: Yes Does the patient have breastfeeding experience prior to this delivery?: No  Feeding    LATCH Score                   Interventions    Lactation Tools Discussed/Used     Consult Status Consult Status: Follow-up Date: 11/30/19 Follow-up type: In-patient    Brittany Barker 11/29/2019, 12:32 PM

## 2019-11-30 MED ORDER — ACETAMINOPHEN 325 MG PO TABS: 650.0000 mg | ORAL_TABLET | ORAL | 0 refills | Status: AC | PRN

## 2019-11-30 NOTE — Lactation Note (Signed)
This note was copied from a baby's chart. Lactation Consultation Note  Patient Name: Brittany Barker [id number] Date: 11/30/2019 Reason for consult: Follow-up assessment;Primapara;1st time breastfeeding;Early term 37-38.6wks;Infant weight loss;Other (Comment)(6 % weight loss)  Baby is 43 hours of age  According to the doc flow sheets - 1 wet and 1 stool in the last 24 hours , pe rmom baby has been cluster feeding since the circ yesterday. 2 wets 2 stools in baby's life.  As LC entered the room mom sitting in the chair holding baby. Per mom the baby recently fed  For 10 mins. At 1045 .  Mom mentioned she is having some nipple soreness due to not always getting the baby deep  Onto the breast. Mom showed [place] how deep she should latch. LC stressed the importance of obtaining depth consistently with every feeding to ensure  A good let down and to enhance weight gain.  LC reviewed sore nipple and engorgement prevention and tx . Per mom has a DEBP ( Medela ) at here with her and is aware the tubing from the [facility] kit will fit, also the hand pump.  LC discussed nutritive vs non - nutritive feeding patterns and the importance of watching the baby for hanging out latched.  Discussed the importance of STS feedings until the baby is back to birth weight , gaining steadily and can stay awake for majority of the feeding.  Mom mentioned she was told to post pump for 15 mins .  LC reviewed the reasons for extra post pumping - gave mom a range of 10 - 20 mins and  To keep it at 5 - 6 feedings. Once milk is in and baby is gaining can come down from the pumping. If the baby only feeds the 1st breast , release the 2nd breast to comfort.  LC  Mentioned to mom she can feed the milk back to baby.  Mom expressed she was feeling very tired. LC recommended  After she gets rest feel free to call with breast feeding questions. Mom has the [facility] pamphlet with phone numbers.   MBURN Brittany Barker in the room to let mom know  more labs needed to be drawn.     Maternal Data    Feeding Feeding Type: (baby fed prior to [facility] entering the room )  [facility] Score                   Interventions    Lactation Tools Discussed/Used     Consult Status      [place] 11/30/2019, 11:48 AM

## 2019-11-30 NOTE — Discharge Instructions (Signed)
Postpartum Care After Vaginal Delivery This sheet gives you information about how to care for yourself from the time you deliver your baby to up to 6-12 weeks after delivery (postpartum period). Your health care provider may also give you more specific instructions. If you have problems or questions, contact your health care provider. Follow these instructions at home: Vaginal bleeding  It is normal to have vaginal bleeding (lochia) after delivery. Wear a sanitary pad for vaginal bleeding and discharge. ? During the first week after delivery, the amount and appearance of lochia is often similar to a menstrual period. ? Over the next few weeks, it will gradually decrease to a dry, yellow-brown discharge. ? For most women, lochia stops completely by 4-6 weeks after delivery. Vaginal bleeding can vary from woman to woman.  Change your sanitary pads frequently. Watch for any changes in your flow, such as: ? A sudden increase in volume. ? A change in color. ? Large blood clots.  If you pass a blood clot from your vagina, save it and call your health care provider to discuss. Do not flush blood clots down the toilet before talking with your health care provider.  Do not use tampons or douches until your health care provider says this is safe.  If you are not breastfeeding, your period should return 6-8 weeks after delivery. If you are feeding your child breast milk only (exclusive breastfeeding), your period may not return until you stop breastfeeding. Perineal care  Keep the area between the vagina and the anus (perineum) clean and dry as told by your health care provider. Use medicated pads and pain-relieving sprays and creams as directed.  If you had a cut in the perineum (episiotomy) or a tear in the vagina, check the area for signs of infection until you are healed. Check for: ? More redness, swelling, or pain. ? Fluid or blood coming from the cut or tear. ? Warmth. ? Pus or a bad  smell.  You may be given a squirt bottle to use instead of wiping to clean the perineum area after you go to the bathroom. As you start healing, you may use the squirt bottle before wiping yourself. Make sure to wipe gently.  To relieve pain caused by an episiotomy, a tear in the vagina, or swollen veins in the anus (hemorrhoids), try taking a warm sitz bath 2-3 times a day. A sitz bath is a warm water bath that is taken while you are sitting down. The water should only come up to your hips and should cover your buttocks. Breast care  Within the first few days after delivery, your breasts may feel heavy, full, and uncomfortable (breast engorgement). Milk may also leak from your breasts. Your health care provider can suggest ways to help relieve the discomfort. Breast engorgement should go away within a few days.  If you are breastfeeding: ? Wear a bra that supports your breasts and fits you well. ? Keep your nipples clean and dry. Apply creams and ointments as told by your health care provider. ? You may need to use breast pads to absorb milk that leaks from your breasts. ? You may have uterine contractions every time you breastfeed for up to several weeks after delivery. Uterine contractions help your uterus return to its normal size. ? If you have any problems with breastfeeding, work with your health care provider or lactation consultant.  If you are not breastfeeding: ? Avoid touching your breasts a lot. Doing this can make   your breasts produce more milk. ? Wear a good-fitting bra and use cold packs to help with swelling. ? Do not squeeze out (express) milk. This causes you to make more milk. Intimacy and sexuality  Ask your health care provider when you can engage in sexual activity. This may depend on: ? Your risk of infection. ? How fast you are healing. ? Your comfort and desire to engage in sexual activity.  You are able to get pregnant after delivery, even if you have not had  your period. If desired, talk with your health care provider about methods of birth control (contraception). Medicines  Take over-the-counter and prescription medicines only as told by your health care provider.  If you were prescribed an antibiotic medicine, take it as told by your health care provider. Do not stop taking the antibiotic even if you start to feel better. Activity  Gradually return to your normal activities as told by your health care provider. Ask your health care provider what activities are safe for you.  Rest as much as possible. Try to rest or take a nap while your baby is sleeping. Eating and drinking   Drink enough fluid to keep your urine pale yellow.  Eat high-fiber foods every day. These may help prevent or relieve constipation. High-fiber foods include: ? Whole grain cereals and breads. ? Brown rice. ? Beans. ? Fresh fruits and vegetables.  Do not try to lose weight quickly by cutting back on calories.  Take your prenatal vitamins until your postpartum checkup or until your health care provider tells you it is okay to stop. Lifestyle  Do not use any products that contain nicotine or tobacco, such as cigarettes and e-cigarettes. If you need help quitting, ask your health care provider.  Do not drink alcohol, especially if you are breastfeeding. General instructions  Keep all follow-up visits for you and your baby as told by your health care provider. Most women visit their health care provider for a postpartum checkup within the first 3-6 weeks after delivery. Contact a health care provider if:  You feel unable to cope with the changes that your child brings to your life, and these feelings do not go away.  You feel unusually sad or worried.  Your breasts become red, painful, or hard.  You have a fever.  You have trouble holding urine or keeping urine from leaking.  You have little or no interest in activities you used to enjoy.  You have not  breastfed at all and you have not had a menstrual period for 12 weeks after delivery.  You have stopped breastfeeding and you have not had a menstrual period for 12 weeks after you stopped breastfeeding.  You have questions about caring for yourself or your baby.  You pass a blood clot from your vagina. Get help right away if:  You have chest pain.  You have difficulty breathing.  You have sudden, severe leg pain.  You have severe pain or cramping in your lower abdomen.  You bleed from your vagina so much that you fill more than one sanitary pad in one hour. Bleeding should not be heavier than your heaviest period.  You develop a severe headache.  You faint.  You have blurred vision or spots in your vision.  You have bad-smelling vaginal discharge.  You have thoughts about hurting yourself or your baby. If you ever feel like you may hurt yourself or others, or have thoughts about taking your own life, get help   right away. You can go to the nearest emergency department or call:  Your local emergency services (911 in the [place]  A suicide crisis helpline, such as the National Suicide Prevention Lifeline at 1-800-273-8255. This is open 24 hours a day. Summary  The period of time right after you deliver your newborn up to 6-12 weeks after delivery is called the postpartum period.  Gradually return to your normal activities as told by your health care provider.  Keep all follow-up visits for you and your baby as told by your health care provider. This information is not intended to replace advice given to you by your health care provider. Make sure you discuss any questions you have with your health care provider. Document Revised: 07/22/2017 Document Reviewed: 05/02/2017 Elsevier Patient Education  2020 Elsevier Inc. Postpartum Hypertension Postpartum hypertension is high blood pressure that remains higher than normal after childbirth. You may not realize that you have  postpartum hypertension if your blood pressure is not being checked regularly. In most cases, postpartum hypertension will go away on its own, usually within a week of delivery. However, for some women, medical treatment is required to prevent serious complications, such as seizures or stroke. What are the causes? This condition may be caused by one or more of the following:  Hypertension that existed before pregnancy (chronic hypertension).  Hypertension that comes on as a result of pregnancy (gestational hypertension).  Hypertensive disorders during pregnancy (preeclampsia) or seizures in women who have high blood pressure during pregnancy (eclampsia).  A condition in which the liver, platelets, and red blood cells are damaged during pregnancy (HELLP syndrome).  A condition in which the thyroid produces too much hormones (hyperthyroidism).  Other rare problems of the nerves (neurological disorders) or blood disorders. In some cases, the cause may not be known. What increases the risk? The following factors may make you more likely to develop this condition:  Chronic hypertension. In some cases, this may not have been diagnosed before pregnancy.  Obesity.  Type 2 diabetes.  Kidney disease.  History of preeclampsia or eclampsia.  Other medical conditions that change the level of hormones in the body (hormonal imbalance). What are the signs or symptoms? As with all types of hypertension, postpartum hypertension may not have any symptoms. Depending on how high your blood pressure is, you may experience:  Headaches. These may be mild, moderate, or severe. They may also be steady, constant, or sudden in onset (thunderclap headache).  Changes in your ability to see (visual changes).  Dizziness.  Shortness of breath.  Swelling of your hands, feet, lower legs, or face. In some cases, you may have swelling in more than one of these locations.  Heart palpitations or a racing  heartbeat.  Difficulty breathing while lying down.  Decrease in the amount of urine that you pass. Other rare signs and symptoms may include:  Sweating more than usual. This lasts longer than a few days after delivery.  Chest pain.  Sudden dizziness when you get up from sitting or lying down.  Seizures.  Nausea or vomiting.  Abdominal pain. How is this diagnosed? This condition may be diagnosed based on the results of a physical exam, blood pressure measurements, and blood and urine tests. You may also have other tests, such as a CT scan or an MRI, to check for other problems of postpartum hypertension. How is this treated? If blood pressure is high enough to require treatment, your options may include:  Medicines to reduce blood pressure (  antihypertensives). Tell your health care provider if you are breastfeeding or if you plan to breastfeed. There are many antihypertensive medicines that are safe to take while breastfeeding.  Stopping medicines that may be causing hypertension.  Treating medical conditions that are causing hypertension.  Treating the complications of hypertension, such as seizures, stroke, or kidney problems. Your health care provider will also continue to monitor your blood pressure closely until it is within a safe range for you. Follow these instructions at home:  Take over-the-counter and prescription medicines only as told by your health care provider.  Return to your normal activities as told by your health care provider. Ask your health care provider what activities are safe for you.  Do not use any products that contain nicotine or tobacco, such as cigarettes and e-cigarettes. If you need help quitting, ask your health care provider.  Keep all follow-up visits as told by your health care provider. This is important. Contact a health care provider if:  Your symptoms get worse.  You have new symptoms, such as: ? A headache that does not get  better. ? Dizziness. ? Visual changes. Get help right away if:  You suddenly develop swelling in your hands, ankles, or face.  You have sudden, rapid weight gain.  You develop difficulty breathing, chest pain, racing heartbeat, or heart palpitations.  You develop severe pain in your abdomen.  You have any symptoms of a stroke. "BE FAST" is an easy way to remember the main warning signs of a stroke: ? B - Balance. Signs are dizziness, sudden trouble walking, or loss of balance. ? E - Eyes. Signs are trouble seeing or a sudden change in vision. ? F - Face. Signs are sudden weakness or numbness of the face, or the face or eyelid drooping on one side. ? A - Arms. Signs are weakness or numbness in an arm. This happens suddenly and usually on one side of the body. ? S - Speech. Signs are sudden trouble speaking, slurred speech, or trouble understanding what people say. ? T - Time. Time to call emergency services. Write down what time symptoms started.  You have other signs of a stroke, such as: ? A sudden, severe headache with no known cause. ? Nausea or vomiting. ? Seizure. These symptoms may represent a serious problem that is an emergency. Do not wait to see if the symptoms will go away. Get medical help right away. Call your local emergency services (911 in the [place] Do not drive yourself to the hospital. Summary  Postpartum hypertension is high blood pressure that remains higher than normal after childbirth.  In most cases, postpartum hypertension will go away on its own, usually within a week of delivery.  For some women, medical treatment is required to prevent serious complications, such as seizures or stroke. This information is not intended to replace advice given to you by your health care provider. Make sure you discuss any questions you have with your health care provider. Document Revised: 08/25/2018 Document Reviewed: 05/09/2017 Elsevier Patient Education  2020 Elsevier  Inc.  

## 2019-11-30 NOTE — Progress Notes (Addendum)
3:11pm- CSW escorted CPS worker Koleen Nimrod to bedside to speak with MOB.  This CSW received call from [place] with [facility] CPS. CSW was advised that Koleen Nimrod has been in contact with MOB. Koleen Nimrod inquired from CSW on when [facility] would be being discharged. CSW advised Koleen Nimrod that CSW would speak with Pediatrician to gather a time frame for this.Per Pediatrcian, infant hasn't been assessed for today as well other lab results are still pending at this time for infant. CSW updated Koleen Nimrod of this.    Claude Manges Cathaleen Korol, MSW, LCSW [facility] [place] [phone]

## 2019-11-30 NOTE — Progress Notes (Signed)
Per Francee Gentile of [facility] CPS, they are no barriers for discharge of infant.

## 2019-12-01 ENCOUNTER — Ambulatory Visit: Payer: Self-pay

## 2019-12-01 NOTE — Lactation Note (Signed)
This note was copied from a baby's chart. Lactation Consultation Note  Patient Name: Brittany Barker EGBTD'V Date: 12/01/2019 Reason for consult: Follow-up assessment;Mother's request;Difficult latch;Primapara;1st time breastfeeding;Hyperbilirubinemia  1613 - 1633 - I followed up with [name] upon request. She reports engorgement. She was changing baby "[name]'s" diaper upon entry. He was cueing to eat. I showed her how to do reverse pressure softening. Transitional milk let down from both breast with RPS. Breasts are full and warm. Areolar tissue is taut.  I assisted with latching [name] in cross cradle to cradle hold. Showed parents how to wait for him to open wide in a yawn to latch. He latched with good depth and rhythmic suckling sequences. [name] reports a little tenderness, but nipple was round upon release. Baby re-latched and continued to eat. We noted softening of the breast.  I gave tips to her support person (dad) on positioning and supporting [name] while she breast feeds. Teaching done at the bedside.  I re-sized her flanges for her DEBP to size 30 flanges due to her engorgement. She was using size 24 flanges at the time.  I recommended continuing to latch baby on demand. Do RPS or pre-pumping to help with softening tissue prior to latching. Post pump the opposite breast if engorged after feeding. Save EBM. If [name] bottle feeds in lieu of breast feeds, I encouraged her to pump.  After leaving her room, I asked the CNA to bring ice to help treat her swelling (engorgement).  All questions answered at this time.    Maternal Data Has patient been taught Hand Expression?: Yes Does the patient have breastfeeding experience prior to this delivery?: No  Feeding Feeding Type: Breast Fed Nipple Type: Regular  LATCH Score Latch: Grasps breast easily, tongue down, lips flanged, rhythmical sucking.  Audible Swallowing: Spontaneous and intermittent  Type of Nipple: Everted at rest  and after stimulation  Comfort (Breast/Nipple): Soft / non-tender  Hold (Positioning): Assistance needed to correctly position infant at breast and maintain latch.  LATCH Score: 9  Interventions Interventions: Breast feeding basics reviewed;Assisted with latch;Skin to skin;Breast massage;Hand express;Reverse pressure;Support pillows;Adjust position;Ice  Lactation Tools Discussed/Used Tools: Pump;Flanges;Other (comment)(ice) Flange Size: 30;27 Breast pump type: Double-Electric Breast Pump Pump Review: Setup, frequency, and cleaning   Consult Status Consult Status: Follow-up Date: 12/02/19 Follow-up type: In-patient    Walker Shadow 12/01/2019, 4:46 PM

## 2019-12-15 ENCOUNTER — Inpatient Hospital Stay (HOSPITAL_COMMUNITY): Admission: RE | Admit: 2019-12-15 | Payer: Medicaid Other | Source: Home / Self Care

## 2019-12-27 ENCOUNTER — Encounter: Payer: Self-pay | Admitting: Certified Nurse Midwife

## 2019-12-27 ENCOUNTER — Telehealth (INDEPENDENT_AMBULATORY_CARE_PROVIDER_SITE_OTHER): Payer: Medicaid Other | Admitting: Certified Nurse Midwife

## 2019-12-27 DIAGNOSIS — F53 Postpartum depression: Secondary | ICD-10-CM

## 2019-12-27 DIAGNOSIS — O99345 Other mental disorders complicating the puerperium: Secondary | ICD-10-CM

## 2019-12-27 NOTE — Progress Notes (Signed)
I connected with Brittany Barker on 12/27/19 at 10:15 AM EDT by: Mychart video and verified that I am speaking with the correct person using two identifiers.  Patient is located at home and provider is located at [employer] for [employer] at [place] .     The purpose of this virtual visit is to provide medical care while limiting exposure to the novel coronavirus. I discussed the limitations, risks, security and privacy concerns of performing an evaluation and management service by MyChart and the availability of in person appointments. I also discussed with the patient that there may be a patient responsible charge related to this service. By engaging in this virtual visit, you consent to the provision of healthcare.  Additionally, you authorize for your insurance to be billed for the services provided during this visit.  The patient expressed understanding and agreed to proceed.  The following staff members participated in the virtual visit:  Corky Crafts CMA and Steward Drone CNM   Post Partum Visit Note Subjective:   Brittany Barker is a 29 y.o. G37P1011 female who presents for a postpartum visit. She is 4 weeks postpartum following a normal spontaneous vaginal delivery.  I have fully reviewed the prenatal and intrapartum course. The delivery was at 37gestational weeks.  Anesthesia: epidural. Postpartum course has been unremarkable. Baby is doing well. Baby is feeding by breast and formula. Bleeding no bleeding. Bowel function is normal. Bladder function is normal. Patient is not sexually active. Contraception method is none. Postpartum depression screening: EPDS = 11 .  The following portions of the patient's history were reviewed and updated as appropriate: allergies, current medications, past medical history, past social history and problem list.  Review of Systems Pertinent items noted in HPI and remainder of comprehensive ROS otherwise negative.   Objective:  There were no vitals filed for  this visit. Self-Obtained       Assessment:   1. Postpartum care and examination - Normal postpartum exam. Pap smear not done at today's visit d/t virtual nature, needs pap done in office in 2 weeks   2. Postpartum depression - patient currently on 50mg  of Zoloft, that she started over the past 2 weeks  - patient reports that the medication is helping slightly but she has sleep insomnia, encouraged to take dose in the morning to help prevent insomnia  - will follow up in 2 weeks for mood check   Plan:   Essential components of care per ACOG recommendations:  1.  Mood and well being: Patient with positive depression screening today. Reviewed local resources for support.  - Patient does not use tobacco. - hx of drug use? Yes   2. Infant care and feeding:  -Patient currently breastmilk feeding? Yes. Reviewed importance of draining breast regularly to support lactation. -Social determinants of health (SDOH) reviewed in EPIC. No concerns  3. Sexuality, contraception and birth spacing - Patient does not want a pregnancy in the next year.  Desired family size is 2 children.  - Reviewed forms of contraception in tiered fashion. Patient desired no method today.   - Discussed birth spacing of 18 months  4. Sleep and fatigue -Encouraged family/partner/community support of 4 hrs of uninterrupted sleep to help with mood and fatigue  5. Physical Recovery  - Discussed patients delivery and complications - Patient had a 1st degree laceration, perineal healing reviewed. Patient expressed understanding - Patient has urinary incontinence? No - Patient is safe to resume physical and sexual activity  6.  Health Maintenance - Last pap smear done in 2015 and was normal with negative HPV. Needs pap smear, appointment scheduled to occur in 2 weeks with mood check   7. Chronic Disease - Follow up needed with hematology, patient reports that she needs   16 minutes of non-face-to-face time spent with  the patient    Lajean Manes, [place] for [place]

## 2020-01-15 ENCOUNTER — Other Ambulatory Visit: Payer: Self-pay

## 2020-01-15 ENCOUNTER — Ambulatory Visit (INDEPENDENT_AMBULATORY_CARE_PROVIDER_SITE_OTHER): Payer: Medicaid Other | Admitting: Certified Nurse Midwife

## 2020-01-15 ENCOUNTER — Encounter: Payer: Self-pay | Admitting: Certified Nurse Midwife

## 2020-01-15 ENCOUNTER — Other Ambulatory Visit (HOSPITAL_COMMUNITY)
Admission: RE | Admit: 2020-01-15 | Discharge: 2020-01-15 | Disposition: A | Discharge status: Home / Self Care | Payer: Medicaid Other | Source: Ambulatory Visit | Attending: Certified Nurse Midwife | Admitting: Certified Nurse Midwife

## 2020-01-15 VITALS — BP 128/83 | HR 76

## 2020-01-15 DIAGNOSIS — Z124 Encounter for screening for malignant neoplasm of cervix: Secondary | ICD-10-CM | POA: Diagnosis present

## 2020-01-15 DIAGNOSIS — F53 Postpartum depression: Secondary | ICD-10-CM | POA: Diagnosis not present

## 2020-01-15 DIAGNOSIS — O99345 Other mental disorders complicating the puerperium: Secondary | ICD-10-CM | POA: Diagnosis not present

## 2020-01-15 NOTE — Progress Notes (Signed)
History:  Ms. [place] Shinn is a 29 y.o. G2P1011 who presents to clinic today for postpartum mood check and pap smear. Patient is 6 weeks 5 days PP SVD. She was started on Zoloft prior to discharge from hospital.   Last pap was in 2015 which was normal, pap needed today. No other complaints or concerns.   The following portions of the patient's history were reviewed and updated as appropriate: allergies, current medications, family history, past medical history, social history, past surgical history and problem list.  Review of Systems:  Review of Systems  Constitutional: Negative.   Respiratory: Negative.   Cardiovascular: Negative.   Gastrointestinal: Negative.   Genitourinary: Negative.   Neurological: Negative.   Psychiatric/Behavioral: Negative.      Objective:  Physical Exam BP 128/83   Pulse 76   LMP 03/10/2019 (Approximate)  Physical Exam Exam conducted with a chaperone present.  HENT:     Head: Normocephalic.  Cardiovascular:     Rate and Rhythm: Normal rate and regular rhythm.  Pulmonary:     Effort: Pulmonary effort is normal. No respiratory distress.     Breath sounds: Normal breath sounds. No wheezing.  Abdominal:     General: There is no distension.     Palpations: Abdomen is soft. There is no mass.     Tenderness: There is no abdominal tenderness. There is no guarding.  Genitourinary:    Exam position: Lithotomy position.     Vagina: Normal.     Cervix: Normal.     Uterus: Normal.      Adnexa: Right adnexa normal and left adnexa normal.  Neurological:     Mental Status: She is alert and oriented to person, place, and time.  Psychiatric:        Mood and Affect: Mood normal.        Behavior: Behavior normal.        Thought Content: Thought content normal.    Assessment & Plan:  1. Encounter for Papanicolaou smear of cervix - pap smear obtained today  - Cytology - PAP( [place])  2. Postpartum depression - patient reports that zoloft is working  slightly, still has "a lot going on at home" is planning on moving to [place] over the next 30 days  - EPDS 7 today, down from 11 two weeks ago  - Patient reports that she think depression will get better once she moves and back with her husband due to him being deployed. Wants to continue on Zoloft  - Discussed with patient that if she feel medication is not working than to call office, patient verbalizes understanding    Sharyon Cable, CNM 01/15/2020 11:00 AM

## 2020-01-15 NOTE — Progress Notes (Signed)
Pt states she is unsure if Zoloft is helping.  Pt scored 7 on EPDS scale today.  Pt states that she does have good support system at home.  Pt states she has a lot going on and she should be moving within the next 30 days.   Pt states she is still having HA's. Pt has not been taking any OTC meds- needs to discuss if she can take.

## 2020-01-17 LAB — CYTOLOGY - PAP
Comment: NEGATIVE
Diagnosis: NEGATIVE
High risk HPV: NEGATIVE

## 2020-01-22 ENCOUNTER — Encounter: Payer: Self-pay | Admitting: Certified Nurse Midwife

## 2020-07-29 IMAGING — US US OB LIMITED
2 series · 14 of 28 positions shown · non-contrast
Comparison: none

CLINICAL DATA: Right lower quadrant pain for 1 week

EXAM:
LIMITED OBSTETRIC ULTRASOUND

[Series 1: us ob limited · 7 of 15 slices shown (1 of 2)]
[im 2/15]
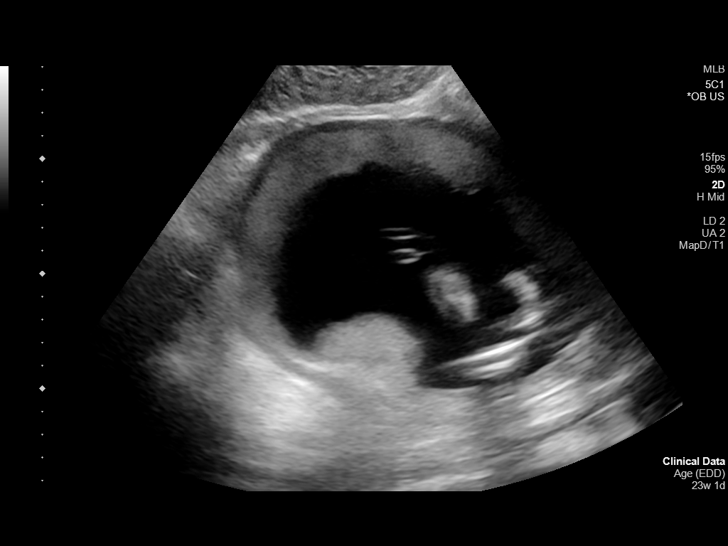
[im 4/15]
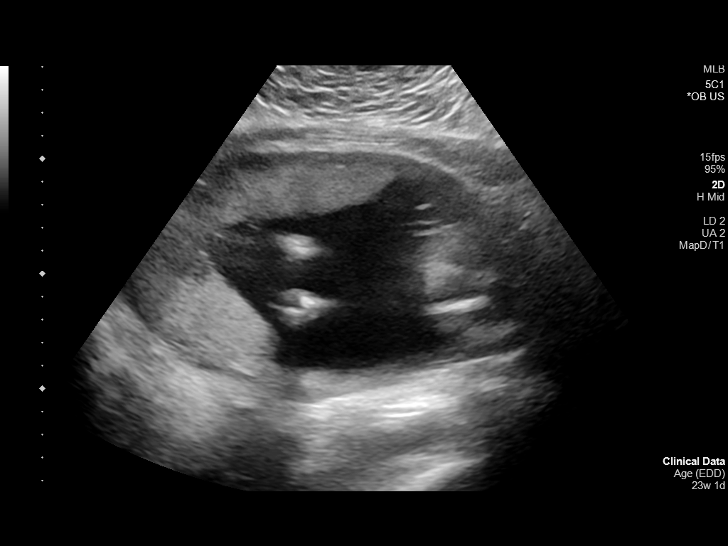
[im 6/15]
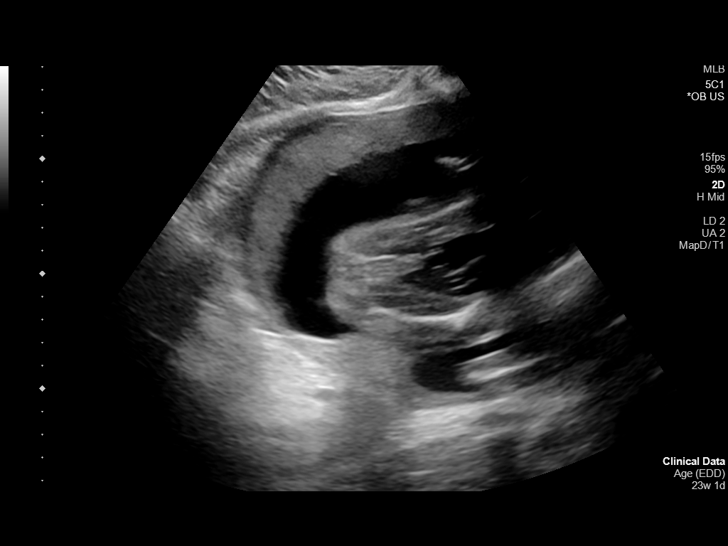
[im 8/15]
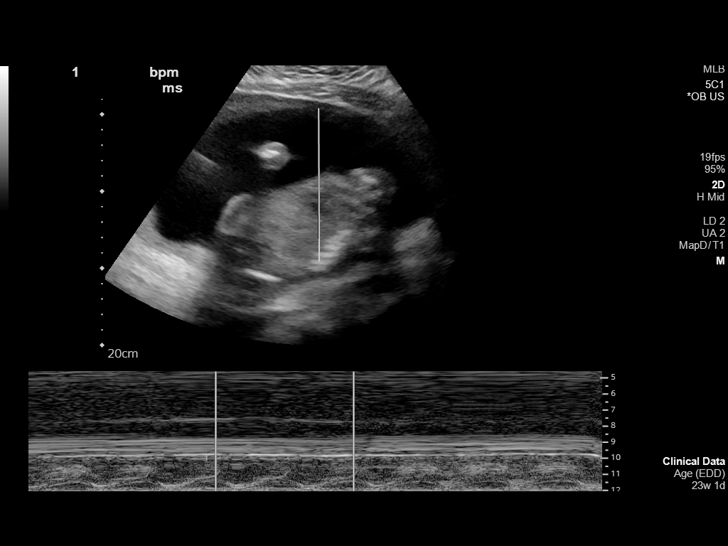
[im 10/15]
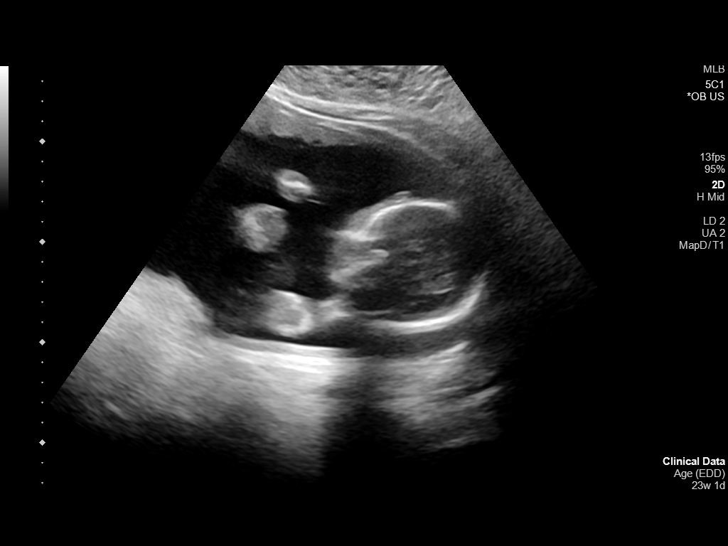
[im 12/15]
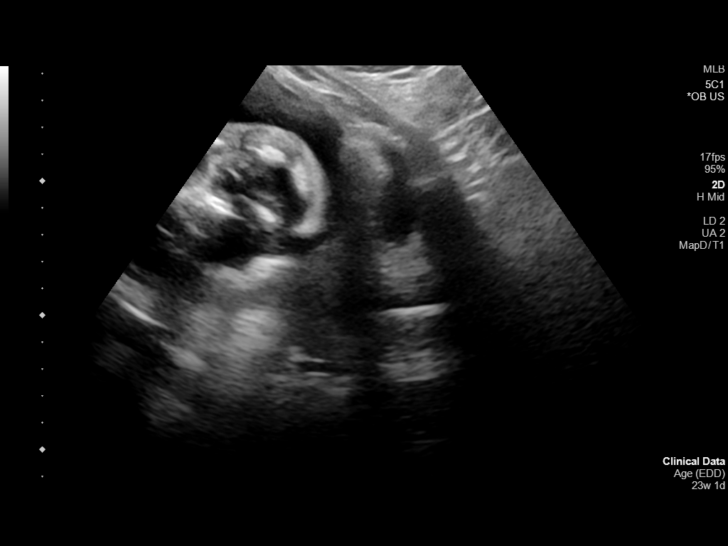
[im 15/15]
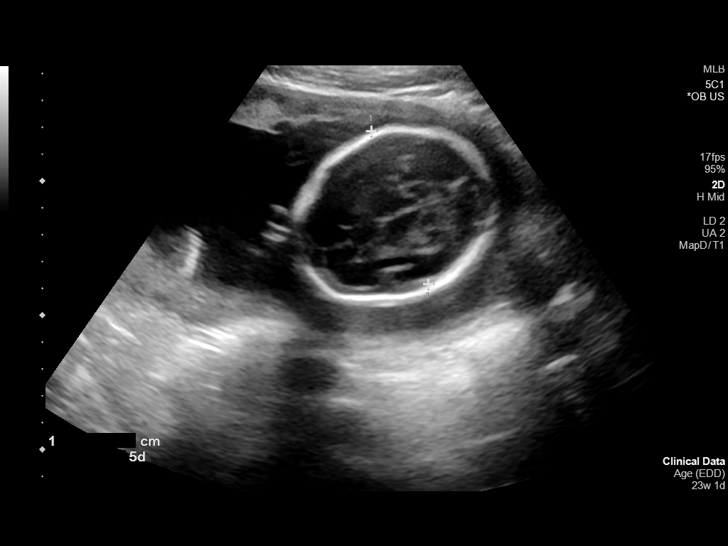

[Series 1001: us ob limited · 7 of 14 slices shown (2 of 2)]
[im 2/14]
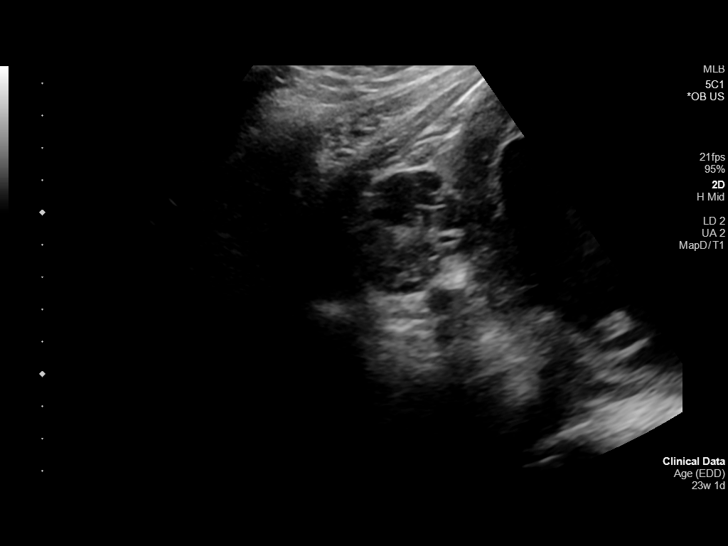
[im 4/14]
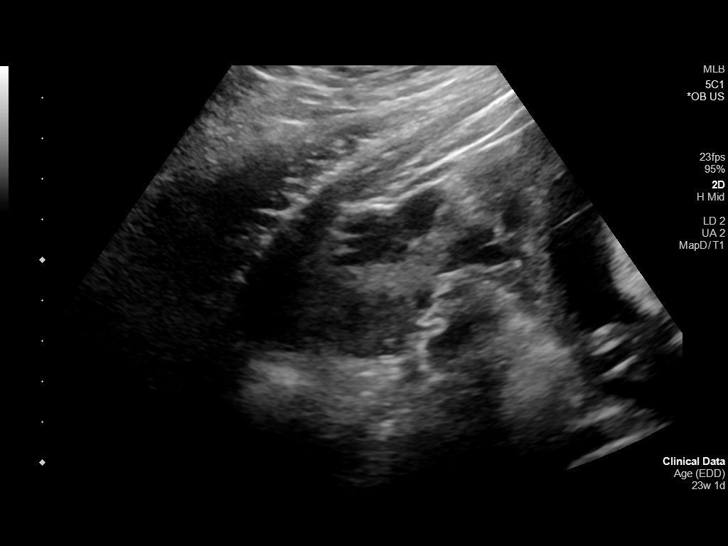
[im 6/14]
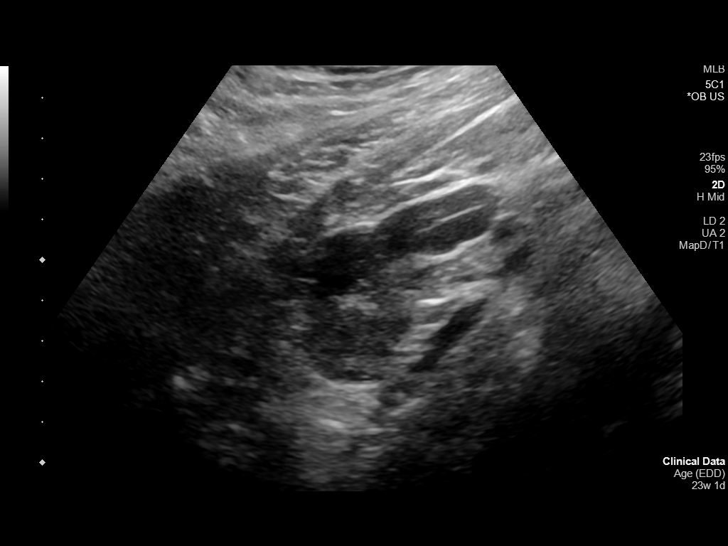
[im 8/14]
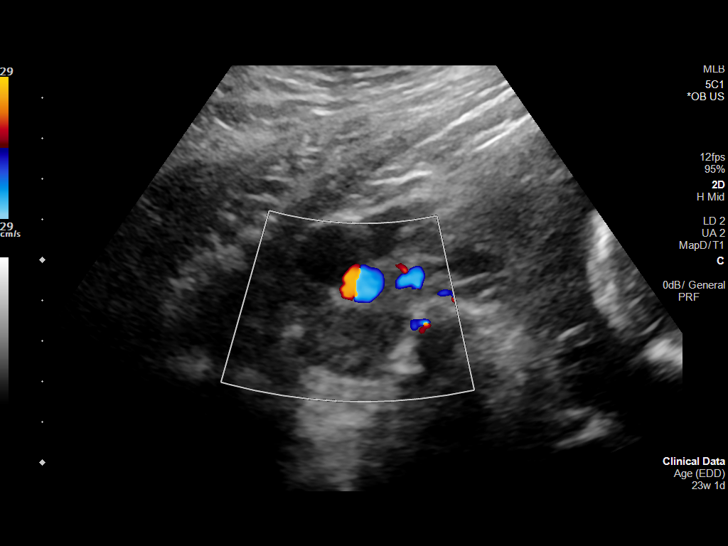
[im 10/14]
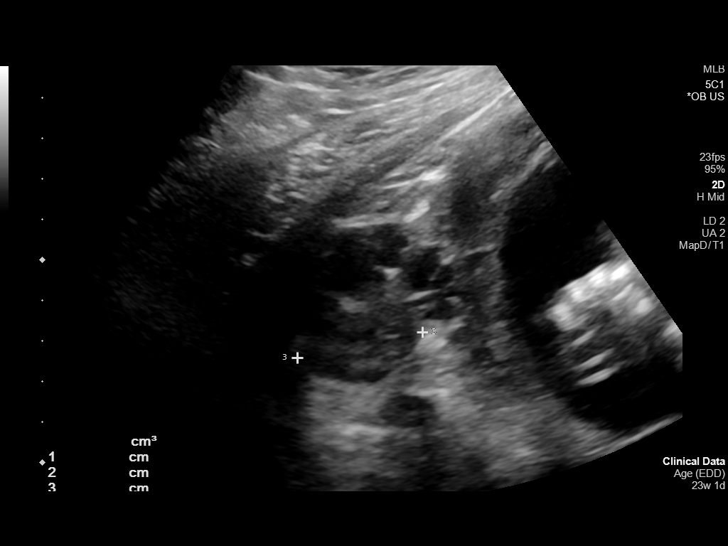
[im 12/14]
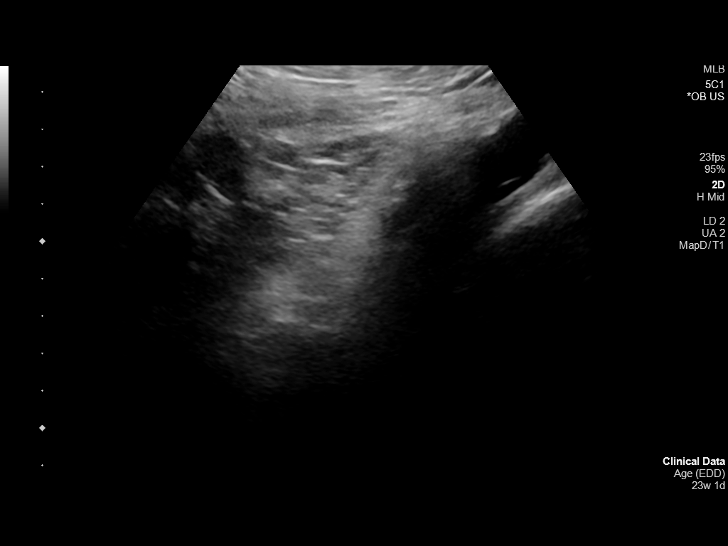
[im 14/14]
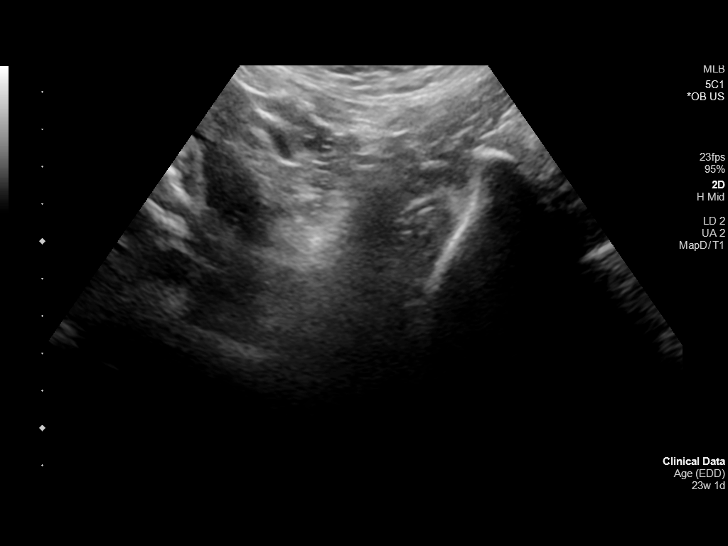

[14 of 28 positions shown; findings below may reference images not displayed]

FINDINGS: Number of Fetuses: 1

Heart Rate:  149 bpm

Movement: Yes

Presentation: Cephalic

Placental Location: Fundal

Previa: No

Amniotic Fluid (Subjective):  Within normal limits.

BPD: 6.1 cm 24 w  5 d

MATERNAL FINDINGS:

Cervix:  Appears closed.

Uterus/Adnexae: Right ovary appears unremarkable. Left ovary was not
visualized.
IMPRESSION: 1. Single live intrauterine gestation measuring 24 weeks 5 days by
BPD.
2. Active fetal heart tones at 149 beats per minute.

This exam is performed on an emergent basis and does not
comprehensively evaluate fetal size, dating, or anatomy; follow-up
complete OB US should be considered if further fetal assessment is
warranted.

## 2020-10-16 IMAGING — US US MFM OB FOLLOW-UP
1 series · 13 of 28 positions shown · non-contrast
Comparison: none

[Series 1: us mfm ob follow-up · 13 of 82 slices shown]
[im 4/82]
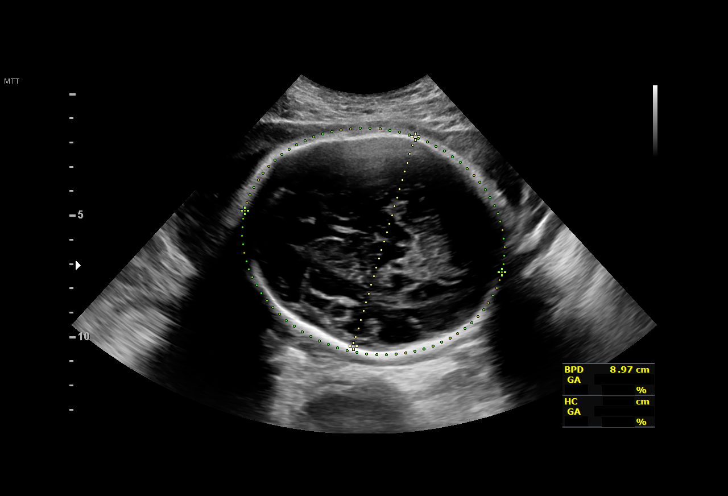
[im 10/82]
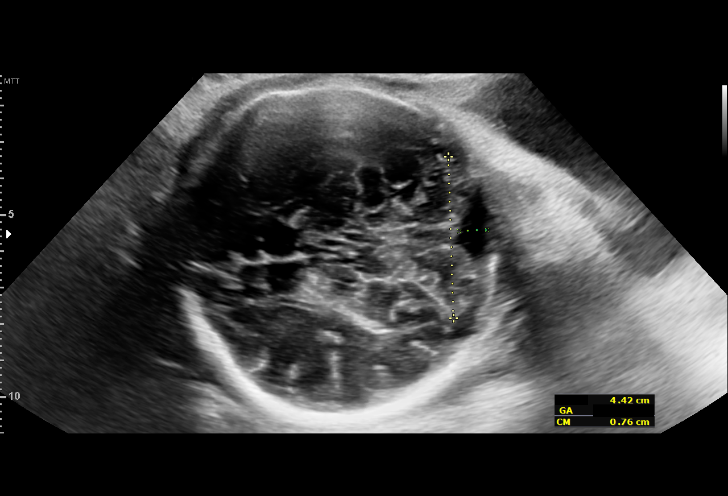
[im 16/82]
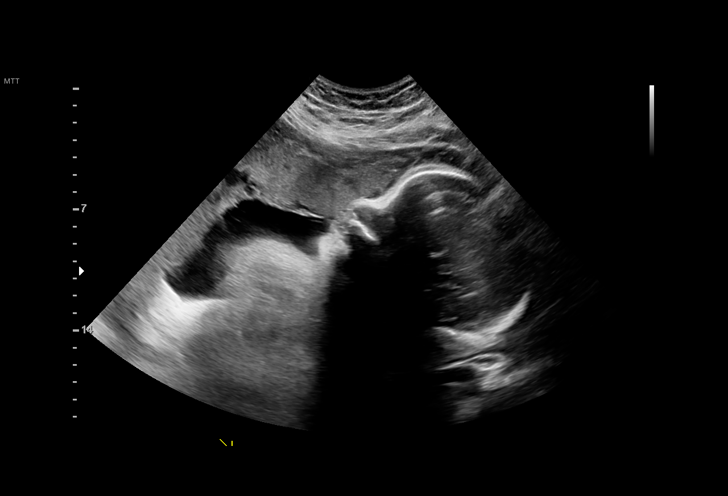
[im 22/82]
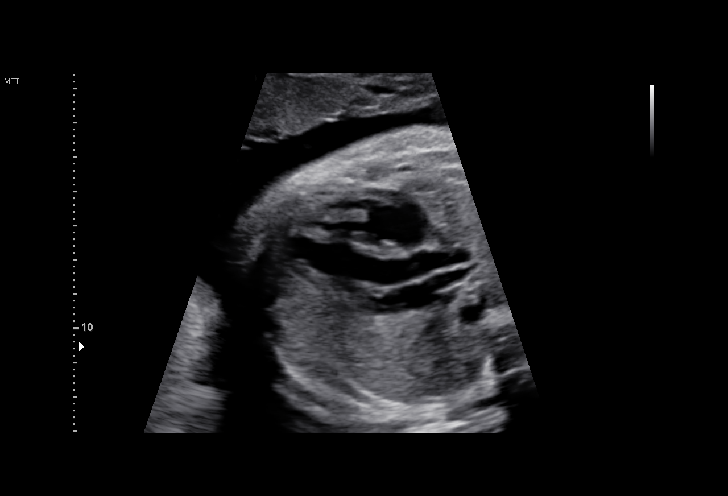
[im 28/82]
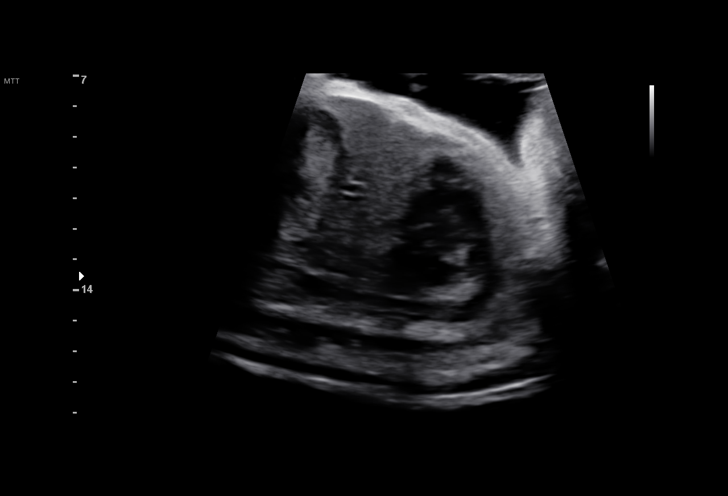
[im 34/82]
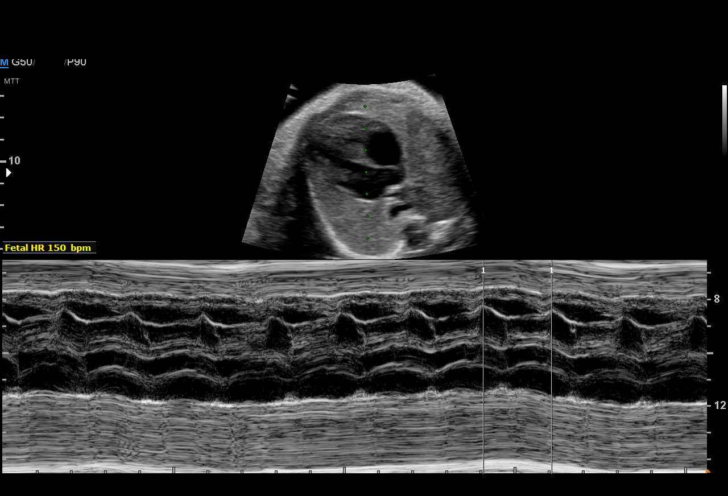
[im 43/82]
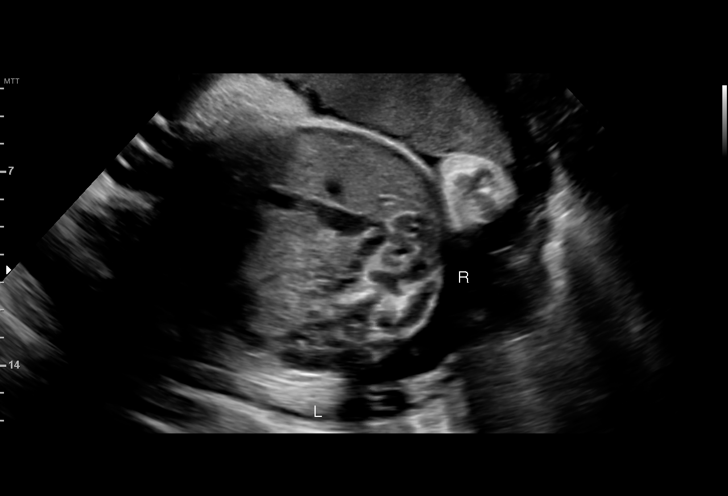
[im 49/82]
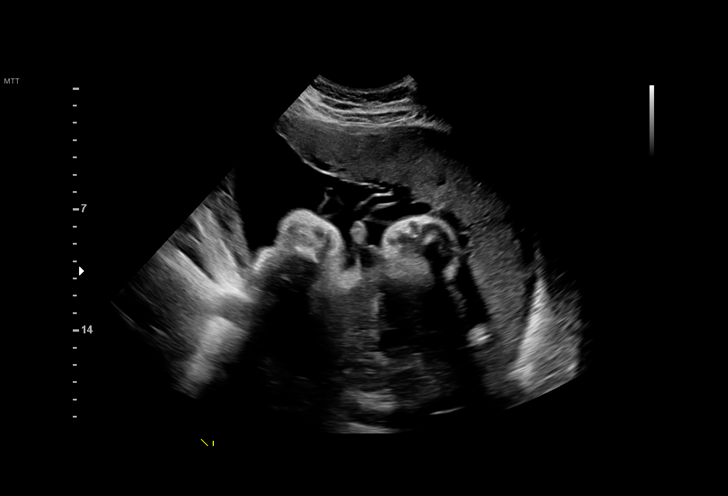
[im 55/82]
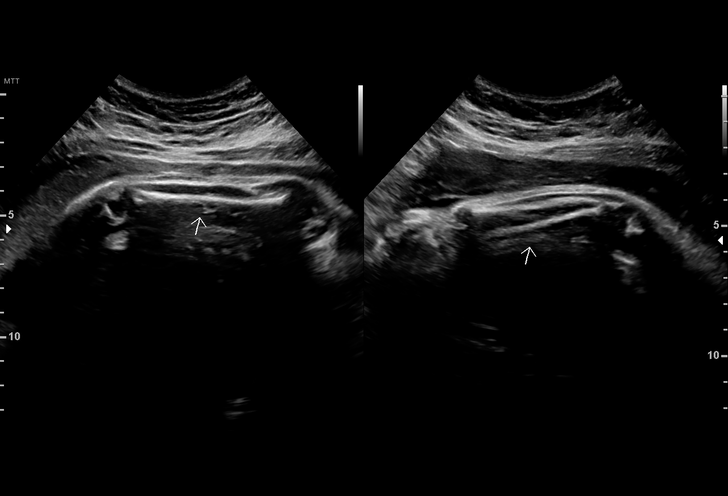
[im 61/82]
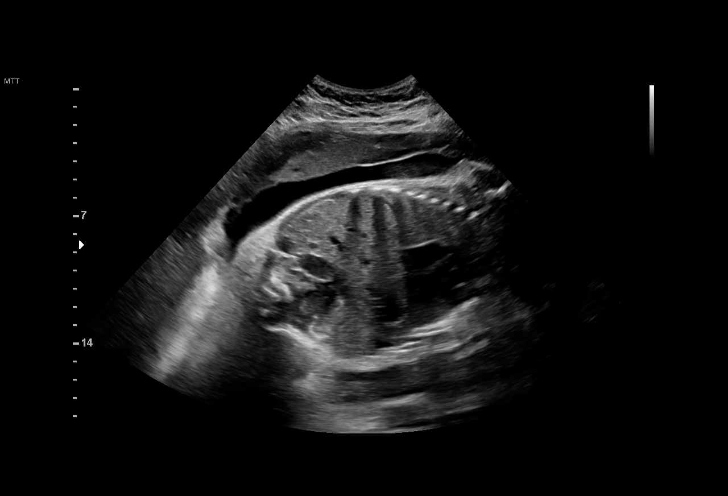
[im 67/82]
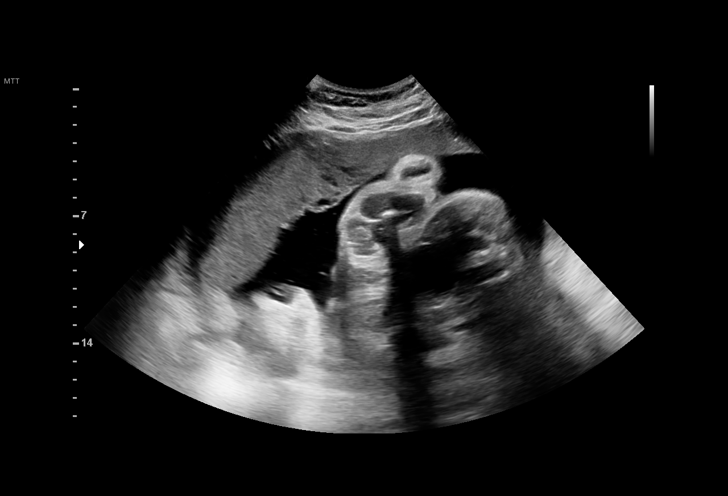
[im 73/82]
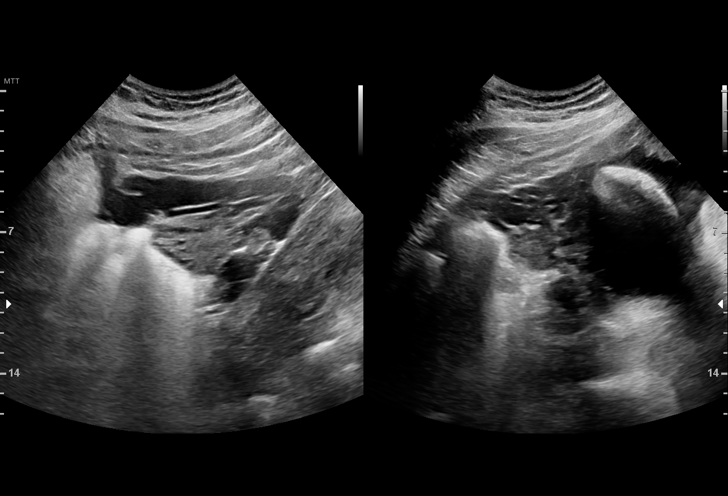
[im 79/82]
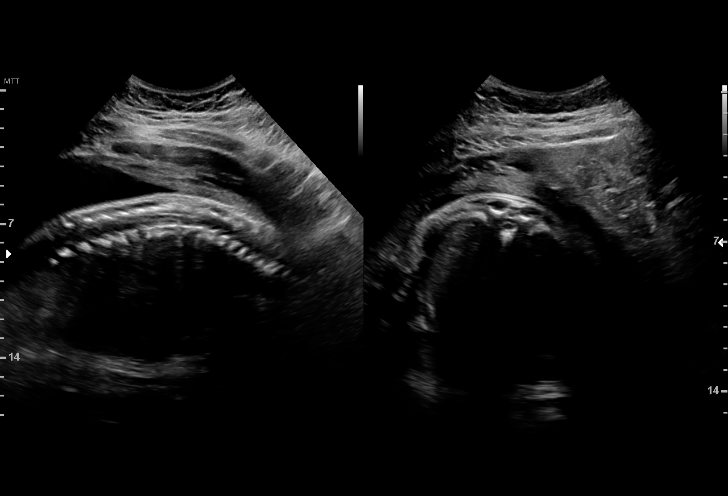

[13 of 28 positions shown; findings below may reference images not displayed]

MENDES DOS SANTOS CNM
                                                            [REDACTED]
                   [id number]

 ----------------------------------------------------------------------

 ----------------------------------------------------------------------
Indications

  Tobacco use complicating pregnancy
  34 weeks gestation of pregnancy
  Medical complication of pregnancy (Von
  Willebrand Disease HCC)
  Antenatal follow-up for nonvisualized fetal
  anatomy
 ----------------------------------------------------------------------
Fetal Evaluation

 Num Of Fetuses:         1
 Fetal Heart Rate(bpm):  150
 Cardiac Activity:       Observed
 Presentation:           Cephalic
 Placenta:               Anterior
 P. Cord Insertion:      Previously Visualized

 Amniotic Fluid
 AFI FV:      Within normal limits

 AFI Sum(cm)     %Tile       Largest Pocket(cm)
 19.88           74

 RUQ(cm)       RLQ(cm)       LUQ(cm)        LLQ(cm)

Biometry

 BPD:      90.3  mm     G. Age:  36w 4d         95  %    CI:         79.1   %    70 - 86
                                                         FL/HC:      21.3   %    19.4 -
 HC:       321   mm     G. Age:  36w 2d         61  %    HC/AC:      1.01        0.96 -
 AC:      316.9  mm     G. Age:  35w 4d         85  %    FL/BPD:     75.6   %    71 - 87
 FL:       68.3  mm     G. Age:  35w 1d         59  %    FL/AC:      21.6   %    20 - 24
 HUM:        61  mm     G. Age:  35w 2d         79  %
 CER:      44.2  mm     G. Age:  38w 1d         88  %
 LV:          3  mm
 CM:        7.6  mm

 Est. FW:    9488  gm           6 lb     80  %
OB History

 Gravidity:    2          SAB:   1
 Living:       0
Gestational Age

 LMP:           34w 3d        Date:  03/10/19                 EDD:   12/15/19
 U/S Today:     35w 6d                                        EDD:   12/05/19
 Best:          34w 3d     Det. By:  LMP  (03/10/19)          EDD:   12/15/19
Anatomy

 Cranium:               Appears normal         Aortic Arch:            Appears normal
 Cavum:                 Appears normal         Ductal Arch:            Appears normal
 Ventricles:            Appears normal         Diaphragm:              Appears normal
 Choroid Plexus:        Appears normal         Stomach:                Appears normal, left
                                                                       sided
 Cerebellum:            Appears normal         Abdomen:                Appears normal
 Posterior Fossa:       Appears normal         Abdominal Wall:         Appears nml (cord
                                                                       insert, abd wall)
 Nuchal Fold:           Not applicable (>20    Cord Vessels:           Appears normal (3
                        wks GA)                                        vessel cord)
 Face:                  Profile nl; orbits not Kidneys:                Appear normal
                        well visualized
 Lips:                  Previously seen        Bladder:                Appears normal
 Thoracic:              Appears normal         Spine:                  Appears normal
 Heart:                 Appears normal         Upper Extremities:      Visualized
                        (4CH, axis, and
                        situs)
 RVOT:                  Appears normal         Lower Extremities:      Visualized
 LVOT:                  Appears normal

 Other:  Male gender. Technically difficult due to advanced gestational age.
         Technically difficult due to fetal position.
Cervix Uterus Adnexa

 Cervix
 Not visualized (advanced GA >22wks)

 Uterus
 No abnormality visualized.

 Left Ovary
 Within normal limits. No adnexal mass visualized.

 Right Ovary
 Within normal limits. No adnexal mass visualized.
 Cul De Sac
 No free fluid seen.

 Adnexa
 No abnormality visualized.
Comments

 This patient was seen for a follow up growth scan due to
 maternal cigarette use.  She denies any problems since her
 last exam and reports that she has screened negative for
 gestational diabetes in her current pregnancy.
 She was informed that the fetal growth and amniotic fluid
 level appears appropriate for her gestational age.
 Cigarette smoking cessation was discussed and reviewed
 with the patient today.  She will try to decrease the amount of
 cigarettes that she smokes.
 Follow-up as indicated.
